# Patient Record
Sex: Female | Born: 1985 | Race: White | Hispanic: No | Marital: Married | Smoking: Never smoker
Health system: Southern US, Community
[De-identification: ages and names within clinical notes are randomized; demographics above are authoritative.]

## PROBLEM LIST (undated history)

## (undated) ENCOUNTER — Inpatient Hospital Stay (HOSPITAL_COMMUNITY): Payer: Self-pay

## (undated) DIAGNOSIS — E039 Hypothyroidism, unspecified: Secondary | ICD-10-CM

---

## 2005-05-04 ENCOUNTER — Emergency Department (HOSPITAL_COMMUNITY): Admission: EM | Admit: 2005-05-04 | Discharge: 2005-05-04 | Payer: Self-pay | Admitting: Emergency Medicine

## 2005-05-10 ENCOUNTER — Emergency Department (HOSPITAL_COMMUNITY): Admission: EM | Admit: 2005-05-10 | Discharge: 2005-05-10 | Payer: Self-pay | Admitting: Family Medicine

## 2005-09-19 ENCOUNTER — Emergency Department (HOSPITAL_COMMUNITY): Admission: EM | Admit: 2005-09-19 | Discharge: 2005-09-19 | Payer: Self-pay | Admitting: Family Medicine

## 2005-10-30 ENCOUNTER — Emergency Department (HOSPITAL_COMMUNITY): Admission: EM | Admit: 2005-10-30 | Discharge: 2005-10-30 | Payer: Self-pay | Admitting: Family Medicine

## 2005-12-07 ENCOUNTER — Emergency Department (HOSPITAL_COMMUNITY): Admission: EM | Admit: 2005-12-07 | Discharge: 2005-12-07 | Payer: Self-pay | Admitting: Emergency Medicine

## 2007-03-26 ENCOUNTER — Other Ambulatory Visit: Admission: RE | Admit: 2007-03-26 | Discharge: 2007-03-26 | Payer: Self-pay | Admitting: Obstetrics and Gynecology

## 2007-09-22 ENCOUNTER — Emergency Department (HOSPITAL_COMMUNITY): Admission: EM | Admit: 2007-09-22 | Discharge: 2007-09-22 | Payer: Self-pay | Admitting: Family Medicine

## 2008-05-06 ENCOUNTER — Other Ambulatory Visit: Admission: RE | Admit: 2008-05-06 | Discharge: 2008-05-06 | Payer: Self-pay | Admitting: Obstetrics and Gynecology

## 2009-08-19 ENCOUNTER — Other Ambulatory Visit: Admission: RE | Admit: 2009-08-19 | Discharge: 2009-08-19 | Payer: Self-pay | Admitting: Obstetrics and Gynecology

## 2010-03-16 ENCOUNTER — Ambulatory Visit (HOSPITAL_COMMUNITY): Admission: RE | Admit: 2010-03-16 | Discharge: 2010-03-16 | Payer: Self-pay | Admitting: Obstetrics and Gynecology

## 2010-04-06 ENCOUNTER — Ambulatory Visit (HOSPITAL_COMMUNITY): Admission: RE | Admit: 2010-04-06 | Discharge: 2010-04-06 | Payer: Self-pay | Admitting: Obstetrics and Gynecology

## 2010-05-05 ENCOUNTER — Ambulatory Visit (HOSPITAL_COMMUNITY): Admission: RE | Admit: 2010-05-05 | Discharge: 2010-05-05 | Payer: Self-pay | Admitting: Obstetrics and Gynecology

## 2010-06-02 ENCOUNTER — Ambulatory Visit (HOSPITAL_COMMUNITY): Admission: RE | Admit: 2010-06-02 | Discharge: 2010-06-02 | Payer: Self-pay | Admitting: Obstetrics and Gynecology

## 2010-06-18 ENCOUNTER — Inpatient Hospital Stay (HOSPITAL_COMMUNITY): Admission: AD | Admit: 2010-06-18 | Discharge: 2010-06-18 | Payer: Self-pay | Admitting: Obstetrics and Gynecology

## 2010-08-12 ENCOUNTER — Inpatient Hospital Stay (HOSPITAL_COMMUNITY)
Admission: AD | Admit: 2010-08-12 | Discharge: 2010-08-13 | Payer: Self-pay | Source: Home / Self Care | Attending: Obstetrics and Gynecology | Admitting: Obstetrics and Gynecology

## 2010-08-12 LAB — COMPREHENSIVE METABOLIC PANEL
ALT: 14 U/L (ref 0–35)
AST: 19 U/L (ref 0–37)
Albumin: 3 g/dL — ABNORMAL LOW (ref 3.5–5.2)
Alkaline Phosphatase: 85 U/L (ref 39–117)
BUN: 2 mg/dL — ABNORMAL LOW (ref 6–23)
CO2: 25 mEq/L (ref 19–32)
Calcium: 9 mg/dL (ref 8.4–10.5)
Chloride: 106 mEq/L (ref 96–112)
Creatinine, Ser: 0.58 mg/dL (ref 0.4–1.2)
GFR calc Af Amer: >60 mL/min (ref >60)
GFR calc non Af Amer: >60 mL/min (ref >60)
Glucose, Bld: 84 mg/dL (ref 70–99)
Potassium: 3.7 mEq/L (ref 3.5–5.1)
Sodium: 138 mEq/L (ref 135–145)
Total Bilirubin: 0.4 mg/dL (ref 0.3–1.2)
Total Protein: 6.3 g/dL (ref 6.0–8.3)

## 2010-08-12 LAB — CBC
HCT: 34.3 % — ABNORMAL LOW (ref 36.0–46.0)
Hemoglobin: 11.3 g/dL — ABNORMAL LOW (ref 12.0–15.0)
MCH: 29.3 pg (ref 26.0–34.0)
MCHC: 32.9 g/dL (ref 30.0–36.0)
MCV: 88.9 fL (ref 78.0–100.0)
Platelets: 276 10*3/uL (ref 150–400)
RBC: 3.86 MIL/uL — ABNORMAL LOW (ref 3.87–5.11)
RDW: 14.8 % (ref 11.5–15.5)
WBC: 12 10*3/uL — ABNORMAL HIGH (ref 4.0–10.5)

## 2010-08-14 ENCOUNTER — Inpatient Hospital Stay (HOSPITAL_COMMUNITY)
Admission: AD | Admit: 2010-08-14 | Discharge: 2010-08-15 | Payer: Self-pay | Source: Home / Self Care | Attending: Obstetrics and Gynecology | Admitting: Obstetrics and Gynecology

## 2010-08-22 LAB — CBC
HCT: 29.6 % — ABNORMAL LOW (ref 36.0–46.0)
Hemoglobin: 9.6 g/dL — ABNORMAL LOW (ref 12.0–15.0)
MCH: 28.8 pg (ref 26.0–34.0)
MCHC: 32.4 g/dL (ref 30.0–36.0)
MCV: 88.9 fL (ref 78.0–100.0)
Platelets: 243 10*3/uL (ref 150–400)
RBC: 3.33 MIL/uL — ABNORMAL LOW (ref 3.87–5.11)
RDW: 14.9 % (ref 11.5–15.5)
WBC: 9.9 10*3/uL (ref 4.0–10.5)

## 2010-08-22 LAB — STREP B DNA PROBE: Strep Group B Ag: NEGATIVE

## 2010-08-28 ENCOUNTER — Encounter: Payer: Self-pay | Admitting: Internal Medicine

## 2010-08-28 ENCOUNTER — Encounter: Payer: Self-pay | Admitting: Obstetrics and Gynecology

## 2010-09-07 ENCOUNTER — Inpatient Hospital Stay (HOSPITAL_COMMUNITY)
Admission: AD | Admit: 2010-09-07 | Discharge: 2010-09-10 | DRG: 651 | Disposition: A | Discharge status: Home / Self Care | Payer: 59 | Source: Ambulatory Visit | Attending: Obstetrics and Gynecology | Admitting: Obstetrics and Gynecology

## 2010-09-07 DIAGNOSIS — E039 Hypothyroidism, unspecified: Secondary | ICD-10-CM | POA: Diagnosis present

## 2010-09-07 DIAGNOSIS — O4290 Premature rupture of membranes, unspecified as to length of time between rupture and onset of labor, unspecified weeks of gestation: Principal | ICD-10-CM | POA: Diagnosis present

## 2010-09-07 DIAGNOSIS — E079 Disorder of thyroid, unspecified: Secondary | ICD-10-CM | POA: Diagnosis present

## 2010-09-07 LAB — COMPREHENSIVE METABOLIC PANEL
ALT: 12 U/L (ref 0–35)
AST: 19 U/L (ref 0–37)
Albumin: 2.7 g/dL — ABNORMAL LOW (ref 3.5–5.2)
Alkaline Phosphatase: 85 U/L (ref 39–117)
BUN: 2 mg/dL — ABNORMAL LOW (ref 6–23)
CO2: 22 mEq/L (ref 19–32)
Calcium: 9.1 mg/dL (ref 8.4–10.5)
Chloride: 109 mEq/L (ref 96–112)
Creatinine, Ser: 0.53 mg/dL (ref 0.4–1.2)
GFR calc Af Amer: >60 mL/min (ref >60)
GFR calc non Af Amer: >60 mL/min (ref >60)
Glucose, Bld: 83 mg/dL (ref 70–99)
Potassium: 3.7 mEq/L (ref 3.5–5.1)
Sodium: 138 mEq/L (ref 135–145)
Total Bilirubin: 0.3 mg/dL (ref 0.3–1.2)
Total Protein: 5.5 g/dL — ABNORMAL LOW (ref 6.0–8.3)

## 2010-09-07 LAB — LACTATE DEHYDROGENASE: LDH: 148 U/L (ref 94–250)

## 2010-09-07 LAB — CBC
HCT: 34 % — ABNORMAL LOW (ref 36.0–46.0)
Hemoglobin: 11.2 g/dL — ABNORMAL LOW (ref 12.0–15.0)
MCH: 29 pg (ref 26.0–34.0)
MCHC: 32.9 g/dL (ref 30.0–36.0)
MCV: 88.1 fL (ref 78.0–100.0)
Platelets: 245 10*3/uL (ref 150–400)
RBC: 3.86 MIL/uL — ABNORMAL LOW (ref 3.87–5.11)
RDW: 15.2 % (ref 11.5–15.5)
WBC: 10.8 10*3/uL — ABNORMAL HIGH (ref 4.0–10.5)

## 2010-09-07 LAB — URIC ACID: Uric Acid, Serum: 5.6 mg/dL (ref 2.4–7.0)

## 2010-09-07 LAB — RPR: RPR Ser Ql: NONREACTIVE

## 2010-09-09 LAB — CBC
HCT: 28.2 % — ABNORMAL LOW (ref 36.0–46.0)
Hemoglobin: 9.1 g/dL — ABNORMAL LOW (ref 12.0–15.0)
MCH: 29.3 pg (ref 26.0–34.0)
MCHC: 32.3 g/dL (ref 30.0–36.0)
MCV: 90.7 fL (ref 78.0–100.0)
Platelets: 194 10*3/uL (ref 150–400)
RBC: 3.11 MIL/uL — ABNORMAL LOW (ref 3.87–5.11)
RDW: 15.5 % (ref 11.5–15.5)
WBC: 9 10*3/uL (ref 4.0–10.5)

## 2010-09-10 NOTE — Discharge Summary (Signed)
  NAMEMACAELA, Dawn Navarro NO.:  [id number]  MEDICAL RECORD NO.:  [id number]          PATIENT TYPE:  INP  LOCATION:  9155                          FACILITY:  [facility]  PHYSICIAN:  Carrington Clamp, M.D. DATE OF BIRTH:  [birth year]  DATE OF ADMISSION:  08/12/2010 DATE OF DISCHARGE:  08/13/2010                              DISCHARGE SUMMARY   FINAL DIAGNOSES: 1. Intrauterine pregnancy at 56 and 3/7th weeks gestation. 2. Bronchitis.  COMPLICATIONS:  None.  This 25 year old G1, P0 presents at 71 weeks' gestation complaining of difficulty breathing.  The patient had been seen in November, was given a Z-Pak for an upper respiratory infection.  She never had a fever.  She had been seen in Urgent Care on August 11, 2010, and given amoxicillin t.i.d.  The patient presenting today to the [facility] with difficulty breathing.  The patient's antepartum course up to this point had been uncomplicated.  She is hypothyroid and has been on 100 mcg of levothyroxine throughout this pregnancy.  Upon admission, she was satting 98% on room air.  She was afebrile and lungs were clear.  The patient did have a chest x-ray performed which was completely negative. The patient was admitted.  No signs of any type of contractions.  She was started on IV antibiotics and was to seen by the respiratory therapist.  By hospital day #2, the patient was breathing better.  Fetal heart tones were reactive.  White blood cell count was about 9.9.  The patient responded well to inhaler and antibiotics and she was felt ready for discharge.  She was sent home on regular diet, told to decrease activities, was told to continue her albuterol twice daily as directed, and her Augmentin 875 mg twice daily for the next 7 days. Instructions and precautions were reviewed with the patient, she was to follow up in our office in 1 week.  LABORATORY DATA ON DISCHARGE:  The patient had a hemoglobin of 9.6, white blood  cell count of 9.9, and platelets of 243,000.     Leilani Able, P.A.-C.   ______________________________ Carrington Clamp, M.D.    MB/MEDQ  D:  08/22/2010  T:  08/23/2010  Job:  [id number]  Electronically Signed by Leilani Able P.A.-C. on 09/05/2010 01:05:17 PM Electronically Signed by Carrington Clamp MD on 09/10/2010 03:44:07 PM

## 2010-09-18 NOTE — Discharge Summary (Signed)
  NAMEANASTACIA, Dawn Navarro               ACCOUNT NO.:  [id number]  MEDICAL RECORD NO.:  [id number]           PATIENT TYPE:  I  LOCATION:  9101                          [facility]:  [facility]  PHYSICIAN:  Jarel Cuadra H. Tenny Craw, MD     DATE OF BIRTH:  12-28-1985  DATE OF ADMISSION:  09/07/2010 DATE OF DISCHARGE:  09/10/2010                              DISCHARGE SUMMARY   ADMITTING PHYSICIAN:  Dr. Ilda Mori.  DISCHARGING PHYSICIAN:  Dr. Almon Hercules.  HOSPITAL DIAGNOSES: 1. 37 weeks' intrauterine pregnancy. 2. Premature rupture of membranes. 3. Augmentation of labor. 4. Primary low transverse cesarean section for failure to progress.  HOSPITAL COURSE:  Ms. Balboni is a 25 year old gravida 1, para 0 who presented at 37 weeks and 3 days of estimated gestational age with spontaneous rupture of membranes.  She was confirmed to be ruptured in maternity admission, however, her cervix was closed.  She was admitted to labor and delivery and underwent labor augmentation with Cytotec. She proceeded to approximately a centimeter dilated, had an intrauterine pressure catheter placed.  The contractions appeared to be adequate. However, she made no further cervical change and was becoming exhausted and the decision was made to proceed with a primary low transverse cesarean section for failure to progress.  This was performed by Dr. Malva Limes on February 2 for delivery of a vigorous female infant in vertex presentation weighing 7 pounds 1 ounce with Apgar scores of 8 and 9.  Postoperatively, the patient did well.  She had good return of bowel function, excellent pain control, and was ambulating well.  She was breast feeding and supplementing without difficulty.  She on postoperative day #2 strongly desired early discharge home and was deemed ready for discharge.  She was instructed on signs and symptoms to be aware for need to return to the hospital.  She was given prescriptions for Percocet for pain  control.  Prior to discharge, she was given measles mumps rubella vaccination due to the fact that she was rubella equivocal.  She will follow up in the office in 4 weeks.  DISCHARGE LABORATORY DATA:  White blood cell count 9.0, hemoglobin 9.1, hematocrit 28.2, and platelet 194.     Freddrick March. Tenny Craw, MD     KHR/MEDQ  D:  09/10/2010  T:  09/11/2010  Job:  [id number]  Electronically Signed by Waynard Reeds MD on 09/16/2010 12:43:09 PM

## 2010-09-20 NOTE — Op Note (Signed)
NAME:  Dawn Navarro, HUOT NO.:  [id number]  MEDICAL RECORD NO.:  [id number]           PATIENT TYPE:  LOCATION:                                 FACILITY:  PHYSICIAN:  Malva Limes, M.D.    DATE OF BIRTH:  1986-04-05  DATE OF PROCEDURE:  09/08/2010 DATE OF DISCHARGE:                              OPERATIVE REPORT   PREOPERATIVE DIAGNOSES: 1. Intrauterine pregnancy at term. 2. Failure to progress.  POSTOPERATIVE DIAGNOSES: 1. Intrauterine pregnancy at term. 2. Failure to progress.  PROCEDURE:  Primary low transverse cesarean section.  SURGEON:  Malva Limes, M.D.  ANESTHESIA:  Epidural.  ANTIBIOTICS:  Ancef 1 g.  DRAINS:  Foley bedside drainage.  ESTIMATED BLOOD LOSS:  900 mL.  SPECIMENS:  None.  COMPLICATIONS:  None.  FINDINGS:  The patient had normal fallopian tubes and ovaries bilaterally.  The uterus appeared to be normal.  The patient delivered 1 live viable white female infant weighing 7 pounds 1 ounce.  INDICATIONS:  Ms. Dawn Navarro is a 25 year old white female G1, P0, [facility] September 25, 2010, was admitted on September 08, 2010, complaining of spontaneous rupture of membranes approximately 3 o'clock in the morning. The patient's prenatal care was complicated by hypothyroidism and morbid obesity.  On admission, the patient was noted to have positive fern, positive pooling.  Her cervix at that time was closed.  She was admitted and given several doses of Cytotec at approximately 6:45 on September 07, 2010, the patient was begun on Pitocin.  This was continued throughout the night.  On the morning of September 08, 2010, the patient had an IUPC placed to document adequate contractions.  At noon, the patient still was 1 cm with significant caput being noted.  The patient stated that she was exhausted and wished to no longer proceed with attempted vaginal birth.  She expressed a desire to have a primary cesarean section.  At this point, the patient was taken  to the operating room for this procedure.  PROCEDURE:  The patient was placed in dorsal supine position with a left lateral tilt.  Once an adequate level was reached, the patient was prepped and draped in the usual fashion for this procedure.  A Pfannenstiel incision was made.  On entering the abdominal cavity, the bladder flap was taken down with sharp dissection.  A low-transverse uterine incision was made in the midline using the Metzenbaum scissors. Amniotic fluid was noted be clear.  The infant was delivered in a vertex presentation with a vacuum extractor.  Remaining infant was then delivered.  There was a nuchal cord x1.  The cord was doubly clamped and cut and the infant handed to the awaiting NICU team.  Placenta was manually removed.  The uterus exteriorized.  The uterine cavity was wiped with a wet lap and examined.  Uterine incision was closed with a single layer of 0 Monocryl suture in a running locking fashion.  The bladder flap was closed using 2-0 Monocryl in a running fashion. Fallopian tubes and ovaries were examined.  The uterus was placed into the abdominal cavity and hemostasis again checked.  It was  felt to be adequate.  The parietal peritoneum and rectus muscles were reapproximated in the midline using 2-0 Monocryl in a running fashion. The fascia was closed using 0 Monocryl suture in a running fashion.  The subcuticular tissue was made hemostatic with the Bovie and closed with interrupted 2-0 plain gut suture, 3-0 Vicryl was used to close the incision in a subcuticular fashion.  Steri-Strips were then placed.  The patient was taken to recovery room in stable condition.  Instrument and lap counts correct x3.          ______________________________ Malva Limes, M.D.     MA/MEDQ  D:  09/08/2010  T:  09/09/2010  Job:  [id number]  Electronically Signed by Malva Limes M.D. on 09/13/2010 12:27:41 PM

## 2010-10-18 LAB — URINALYSIS, ROUTINE W REFLEX MICROSCOPIC
Bilirubin Urine: NEGATIVE
Glucose, UA: NEGATIVE mg/dL
Hgb urine dipstick: NEGATIVE
Ketones, ur: NEGATIVE mg/dL
Nitrite: NEGATIVE
Protein, ur: NEGATIVE mg/dL
Specific Gravity, Urine: <1.005 — ABNORMAL LOW (ref 1.005–1.030)
Urobilinogen, UA: 0.2 mg/dL (ref 0.0–1.0)
pH: 7 (ref 5.0–8.0)

## 2010-10-18 LAB — CBC
HCT: 32.9 % — ABNORMAL LOW (ref 36.0–46.0)
Hemoglobin: 11.3 g/dL — ABNORMAL LOW (ref 12.0–15.0)
MCH: 31 pg (ref 26.0–34.0)
MCHC: 34.2 g/dL (ref 30.0–36.0)
MCV: 90.7 fL (ref 78.0–100.0)
Platelets: 266 10*3/uL (ref 150–400)
RBC: 3.63 MIL/uL — ABNORMAL LOW (ref 3.87–5.11)
RDW: 14.1 % (ref 11.5–15.5)
WBC: 10.5 10*3/uL (ref 4.0–10.5)

## 2010-10-18 LAB — COMPREHENSIVE METABOLIC PANEL
ALT: 12 U/L (ref 0–35)
AST: 13 U/L (ref 0–37)
Albumin: 3 g/dL — ABNORMAL LOW (ref 3.5–5.2)
Alkaline Phosphatase: 61 U/L (ref 39–117)
BUN: 4 mg/dL — ABNORMAL LOW (ref 6–23)
CO2: 25 mEq/L (ref 19–32)
Calcium: 9.4 mg/dL (ref 8.4–10.5)
Chloride: 104 mEq/L (ref 96–112)
Creatinine, Ser: 0.58 mg/dL (ref 0.4–1.2)
GFR calc Af Amer: >60 mL/min (ref >60)
GFR calc non Af Amer: >60 mL/min (ref >60)
Glucose, Bld: 89 mg/dL (ref 70–99)
Potassium: 4 mEq/L (ref 3.5–5.1)
Sodium: 137 mEq/L (ref 135–145)
Total Bilirubin: 0.4 mg/dL (ref 0.3–1.2)
Total Protein: 6.7 g/dL (ref 6.0–8.3)

## 2010-10-18 LAB — URIC ACID: Uric Acid, Serum: 4.8 mg/dL (ref 2.4–7.0)

## 2011-05-25 ENCOUNTER — Other Ambulatory Visit: Payer: BC Managed Care – PPO | Admitting: Internal Medicine

## 2011-05-25 ENCOUNTER — Encounter: Payer: Self-pay | Admitting: Internal Medicine

## 2011-05-25 DIAGNOSIS — Z0000 Encounter for general adult medical examination without abnormal findings: Secondary | ICD-10-CM

## 2011-05-25 LAB — LIPID PANEL
Cholesterol: 261 mg/dL — ABNORMAL HIGH (ref 0–200)
HDL: 38 mg/dL — ABNORMAL LOW (ref >39)
LDL Cholesterol: 182 mg/dL — ABNORMAL HIGH (ref 0–99)
Total CHOL/HDL Ratio: 6.9 Ratio
Triglycerides: 204 mg/dL — ABNORMAL HIGH (ref <150)
VLDL: 41 mg/dL — ABNORMAL HIGH (ref 0–40)

## 2011-05-25 LAB — CBC WITH DIFFERENTIAL/PLATELET
Basophils Absolute: 0 10*3/uL (ref 0.0–0.1)
Basophils Relative: 0 % (ref 0–1)
Eosinophils Absolute: 0.2 10*3/uL (ref 0.0–0.7)
Eosinophils Relative: 2 % (ref 0–5)
HCT: 40.4 % (ref 36.0–46.0)
Hemoglobin: 12.7 g/dL (ref 12.0–15.0)
Lymphocytes Relative: 28 % (ref 12–46)
Lymphs Abs: 2.4 10*3/uL (ref 0.7–4.0)
MCH: 28.7 pg (ref 26.0–34.0)
MCHC: 31.4 g/dL (ref 30.0–36.0)
MCV: 91.4 fL (ref 78.0–100.0)
Monocytes Absolute: 0.6 10*3/uL (ref 0.1–1.0)
Monocytes Relative: 7 % (ref 3–12)
Neutro Abs: 5.4 10*3/uL (ref 1.7–7.7)
Neutrophils Relative %: 63 % (ref 43–77)
Platelets: 339 10*3/uL (ref 150–400)
RBC: 4.42 MIL/uL (ref 3.87–5.11)
RDW: 14.7 % (ref 11.5–15.5)
WBC: 8.5 10*3/uL (ref 4.0–10.5)

## 2011-05-25 LAB — COMPREHENSIVE METABOLIC PANEL
ALT: 11 U/L (ref 0–35)
AST: 11 U/L (ref 0–37)
Albumin: 4.1 g/dL (ref 3.5–5.2)
Alkaline Phosphatase: 55 U/L (ref 39–117)
BUN: 8 mg/dL (ref 6–23)
CO2: 26 mEq/L (ref 19–32)
Calcium: 8.9 mg/dL (ref 8.4–10.5)
Chloride: 106 mEq/L (ref 96–112)
Creat: 0.79 mg/dL (ref 0.50–1.10)
Glucose, Bld: 90 mg/dL (ref 70–99)
Potassium: 4.3 mEq/L (ref 3.5–5.3)
Sodium: 139 mEq/L (ref 135–145)
Total Bilirubin: 0.4 mg/dL (ref 0.3–1.2)
Total Protein: 6.6 g/dL (ref 6.0–8.3)

## 2011-05-25 LAB — TSH: TSH: 1.169 u[IU]/mL (ref 0.350–4.500)

## 2011-05-26 ENCOUNTER — Ambulatory Visit (INDEPENDENT_AMBULATORY_CARE_PROVIDER_SITE_OTHER): Payer: BC Managed Care – PPO | Admitting: Internal Medicine

## 2011-05-26 ENCOUNTER — Encounter: Payer: Self-pay | Admitting: Internal Medicine

## 2011-05-26 VITALS — BP 108/84 | HR 92 | Temp 98.9°F | Ht 67.0 in | Wt 272.0 lb

## 2011-05-26 DIAGNOSIS — Z23 Encounter for immunization: Secondary | ICD-10-CM

## 2011-05-26 DIAGNOSIS — Z0000 Encounter for general adult medical examination without abnormal findings: Secondary | ICD-10-CM

## 2011-05-26 DIAGNOSIS — E669 Obesity, unspecified: Secondary | ICD-10-CM

## 2011-05-26 DIAGNOSIS — E039 Hypothyroidism, unspecified: Secondary | ICD-10-CM

## 2011-05-26 DIAGNOSIS — E282 Polycystic ovarian syndrome: Secondary | ICD-10-CM

## 2011-05-26 LAB — POCT URINALYSIS DIPSTICK
Blood, UA: NEGATIVE
Leukocytes, UA: NEGATIVE
Nitrite, UA: NEGATIVE
Protein, UA: NEGATIVE
pH, UA: 7

## 2011-05-26 LAB — VITAMIN D 25 HYDROXY (VIT D DEFICIENCY, FRACTURES): Vit D, 25-Hydroxy: 31 ng/mL (ref 30–89)

## 2011-06-03 NOTE — Progress Notes (Signed)
  Subjective:    Patient ID: Dawn Navarro, female    DOB: [birth year], 25 y.o.   MRN: [id number]  HPI first visit for this 25 year old white female living in [place] with history of polycystic ovary syndrome. Took Clomid to get pregnant. Delivered a baby in February 2012 by C-section for failure to progress. History of hypothyroidism and has been on levothyroxin 0.1 mg daily for 2 years. Is on oral contraceptives. 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 Patient is a 4 year college degree. Is married. Does not smoke or consume alcohol. TSH last checked July 2011 by GYN in result was 1.174. Free T4 was 1.22 . Dawn Navarro is worried about having polycystic ovary syndrome and what the consequences might be.  Family history mother with history of hypothyroidism and is 71 years of age. Father age 53 on chemotherapy with history of non-Hodgkin's lymphoma and remote history of prostate cancer. 2 brothers in good health.   History of irregular menses. GYN care done through [facility]. Treated for 10 vaginitis by GYN August 2012 and June 2012. In March 2012 had Pap smear with ASCUS .    Review of Systems noncontributory except for irregular menses and episodes of Candida vaginitis     Objective:   Physical Exam  Vitals reviewed. Constitutional: Dawn Navarro is oriented to person, place, and time.  HENT:  Head: Normocephalic.  Right Ear: External ear normal.  Left Ear: External ear normal.  Mouth/Throat: Oropharynx is clear and moist.  Neck: Neck supple. No thyromegaly present.  Cardiovascular: Normal rate, regular rhythm and normal heart sounds.   No murmur heard. Pulmonary/Chest: Effort normal and breath sounds normal. Dawn Navarro has no wheezes. Dawn Navarro has no rales.  Abdominal: Soft. Bowel sounds are normal. Dawn Navarro exhibits no mass. There is no tenderness.  Genitourinary:       Deferred to GYN physician  Musculoskeletal: Dawn Navarro exhibits no edema.  Lymphadenopathy:    Dawn Navarro  has no cervical adenopathy.  Neurological: Dawn Navarro is alert and oriented to person, place, and time. Dawn Navarro has normal reflexes. No cranial nerve deficit.  Skin: Skin is warm and dry.  Psychiatric: Dawn Navarro has a normal mood and affect. Her behavior is normal. Judgment and thought content normal.          Assessment & Plan:  Polycystic ovary syndrome by history  Hypothyroidism on thyroid replacement therapy  Obesity  Status post C-section February 2012. Conceived using Clomid  Plan: Suggested to patient Dawn Navarro may want to see endocrinologist for consultation and Dawn Navarro is agreeable with this.

## 2011-06-04 DIAGNOSIS — E669 Obesity, unspecified: Secondary | ICD-10-CM | POA: Insufficient documentation

## 2011-06-04 DIAGNOSIS — E039 Hypothyroidism, unspecified: Secondary | ICD-10-CM | POA: Insufficient documentation

## 2011-06-04 DIAGNOSIS — E282 Polycystic ovarian syndrome: Secondary | ICD-10-CM | POA: Insufficient documentation

## 2011-06-04 NOTE — Patient Instructions (Addendum)
Thyroid functions have been checked today. Continue thyroid replacement therapy. We will [facility] appointment for you to see endocrinologist regarding polycystic ovary syndrome issues. Influenza immunization administered today

## 2011-09-11 ENCOUNTER — Encounter: Payer: Self-pay | Admitting: Internal Medicine

## 2011-09-11 ENCOUNTER — Ambulatory Visit (INDEPENDENT_AMBULATORY_CARE_PROVIDER_SITE_OTHER): Payer: BC Managed Care – PPO | Admitting: Internal Medicine

## 2011-09-11 VITALS — BP 102/80 | HR 84 | Temp 98.9°F | Wt 273.0 lb

## 2011-09-11 DIAGNOSIS — J069 Acute upper respiratory infection, unspecified: Secondary | ICD-10-CM

## 2011-09-11 DIAGNOSIS — H6690 Otitis media, unspecified, unspecified ear: Secondary | ICD-10-CM

## 2011-09-11 DIAGNOSIS — H6691 Otitis media, unspecified, right ear: Secondary | ICD-10-CM

## 2011-09-11 NOTE — Progress Notes (Signed)
  Subjective:    Patient ID: Dawn Navarro, female    DOB: [birth year], 26 y.o.   MRN: [id number]  HPI 26 year old white female who has a 27-year-old in daycare. Has been exposed to a lot of illnesses recently due to child being sick quite a bit. Patient has had respiratory congestion, sore throat, postnasal drip, discolored sputum production. Right ear feels full. No fever or shaking chills.    Review of Systems     Objective:   Physical Exam HEENT exam: Pharynx is slightly injected; right TM is full but not red; left TM slightly dull but not red; neck is supple without significant adenopathy; chest clear        Assessment & Plan:  Right otitis media  Upper respiratory infection  Plan: Zithromax Z-Pak take 2 tablets by mouth day one followed by 1 tablet by mouth days 2 through 5. Refill prescription if not better in one week. Hycodan 8 ounces 1 teaspoon by mouth every 6 hours when necessary cough.

## 2011-09-11 NOTE — Patient Instructions (Signed)
Take Zithromax Z-PAK as directed. Refill prescription if not better in one week. Take Hycodan at bedtime for cough.

## 2011-10-17 ENCOUNTER — Encounter: Payer: Self-pay | Admitting: Internal Medicine

## 2011-10-17 ENCOUNTER — Ambulatory Visit (INDEPENDENT_AMBULATORY_CARE_PROVIDER_SITE_OTHER): Payer: BC Managed Care – PPO | Admitting: Internal Medicine

## 2011-10-17 ENCOUNTER — Telehealth: Payer: Self-pay

## 2011-10-17 VITALS — BP 114/90 | HR 112 | Temp 99.3°F

## 2011-10-17 DIAGNOSIS — R110 Nausea: Secondary | ICD-10-CM

## 2011-10-17 MED ORDER — ONDANSETRON HCL 4 MG/2ML IJ SOLN
4.0000 mg | Freq: Once | INTRAMUSCULAR | Status: AC
  Administered 2011-10-17: 4 mg via INTRAMUSCULAR

## 2011-10-17 NOTE — Telephone Encounter (Signed)
Patient has GI virus, probably from infant daughter who has it also. Nausea, vomiting started last pm, now has diarrhea and low grade fever. Has Reglan at home, doesn't know if it would help the nausea.really would prefer not to come in.

## 2011-10-17 NOTE — Telephone Encounter (Signed)
See this afternoon

## 2011-10-18 NOTE — Progress Notes (Signed)
  Subjective:    Patient ID: Dawn Navarro, female    DOB: 10-Sep-1985, 26 y.o.   MRN: [id number]  HPI  26 year old white female with polycystic ovary syndrome and hypothyroidism who is daughter-in-law of Dashawn Golda also a patient here in today with nausea vomiting and diarrhea. Symptoms are severe. Patient has malaise and is pale.Apparently has had exposure to nor a virus. Other family members have been ill.     Review of Systems     Objective:   Physical Exam patient is volume depleted but not in need of IV fluids in my opinion.chest is clear, cardiac exam tachycardic. Regular rate and rhythm.        Assessment & Plan:  Gastroenteritis-likely nor ovarians  Plan: Injection of antiemetic here in the office. Phenergan rectal suppositories 25 mg 1 PR Q4 hours when necessary nausea. Sips of liquids over ice every one to 2 hours until can take more by mouth. Call if not better in 24 hours or sooner if worse.

## 2011-11-03 NOTE — Patient Instructions (Signed)
Use Phenergan as directed. Try sips of liquids over crushed ice every one to 2 hours. Stay away from milk water and orange juice. Try crackers or dry toast if tolerated. Call if not better in 24 hours

## 2011-11-27 ENCOUNTER — Other Ambulatory Visit: Payer: BC Managed Care – PPO | Admitting: Internal Medicine

## 2011-11-27 DIAGNOSIS — E039 Hypothyroidism, unspecified: Secondary | ICD-10-CM

## 2011-11-27 DIAGNOSIS — E7800 Pure hypercholesterolemia, unspecified: Secondary | ICD-10-CM

## 2011-11-27 LAB — LIPID PANEL
LDL Cholesterol: 169 mg/dL — ABNORMAL HIGH (ref 0–99)
Total CHOL/HDL Ratio: 7.9 Ratio

## 2011-11-27 LAB — TSH: TSH: 1.291 u[IU]/mL (ref 0.350–4.500)

## 2011-11-28 ENCOUNTER — Ambulatory Visit (INDEPENDENT_AMBULATORY_CARE_PROVIDER_SITE_OTHER): Payer: BC Managed Care – PPO | Admitting: Internal Medicine

## 2011-11-28 VITALS — BP 120/76 | HR 84 | Resp 20 | Ht 68.0 in | Wt 270.0 lb

## 2011-11-28 DIAGNOSIS — E785 Hyperlipidemia, unspecified: Secondary | ICD-10-CM

## 2011-11-28 DIAGNOSIS — E669 Obesity, unspecified: Secondary | ICD-10-CM

## 2011-11-28 DIAGNOSIS — E282 Polycystic ovarian syndrome: Secondary | ICD-10-CM

## 2011-12-03 ENCOUNTER — Encounter: Payer: Self-pay | Admitting: Internal Medicine

## 2011-12-03 NOTE — Patient Instructions (Signed)
Start Lipitor 10 mg daily and return in 3 months for fasting lipid panel liver functions and office visit.

## 2011-12-03 NOTE — Progress Notes (Signed)
  Subjective:    Patient ID: Dawn Navarro, female    DOB: [birth year], 26 y.o.   MRN: [id number]  HPI 26 year old white female overweight in today for followup of hyperlipidemia. Recent fasting lab work reviewed. No significant change and lipid panel over past 6 months. Both parents have history of hyperlipidemia. Father has history of hypertension. 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 History of polycystic ovary syndrome.   Review of Systems     Objective:   Physical Exam no thyromegaly; chest clear; cardiac exam regular rate and rhythm. Blood pressure 120/76. Pulse 84. Recent lipid panel shows total cholesterol of 260 and previously was 261 in October 2012. Triglycerides are now 290 and previously were 204. However her LDL cholesterol has improved from 182 to 169.        Assessment & Plan:  Obesity  Hyperlipidemia-familial mixed  Plan: Lipitor 10 mg daily and recheck fasting lipid panel liver functions in 3 months

## 2012-01-27 IMAGING — US US OB FOLLOW-UP
1 series · 18 of 28 positions shown · non-contrast
Comparison: none

OBSTETRICAL ULTRASOUND:
 This ultrasound was performed in The [HOSPITAL], and the [facility] report will be stored to [REDACTED] PACS.  This report is also available in [HOSPITAL]?s accessANYware.

[Series 1: us ob follow-up · 18 of 46 slices shown]
[im 1/46]
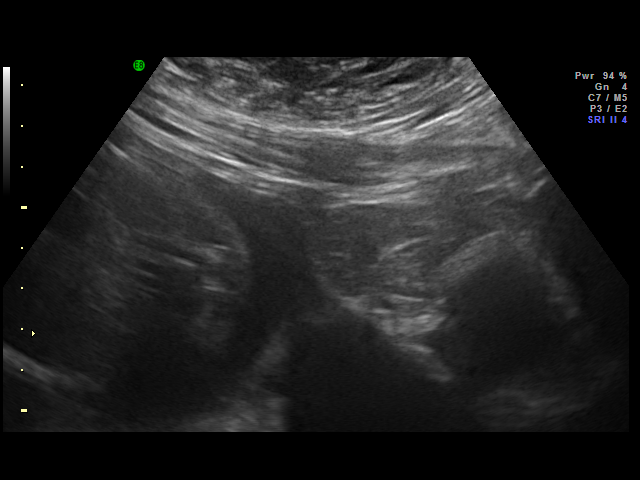
[im 4/46]
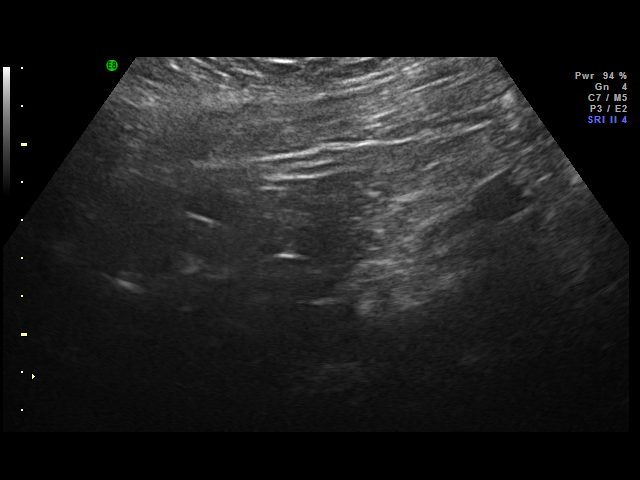
[im 6/46]
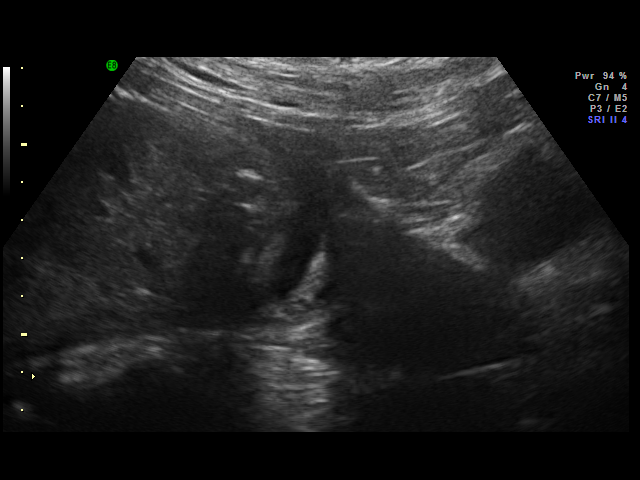
[im 9/46]
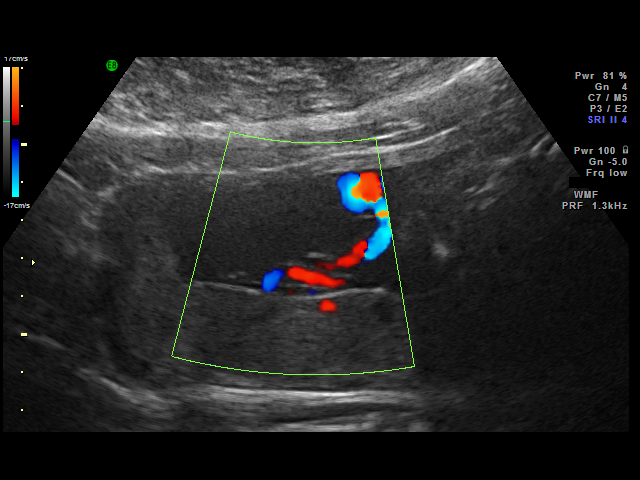
[im 12/46]
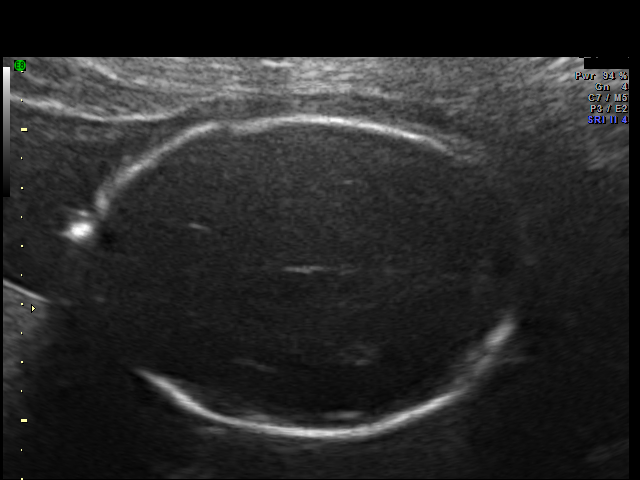
[im 14/46]
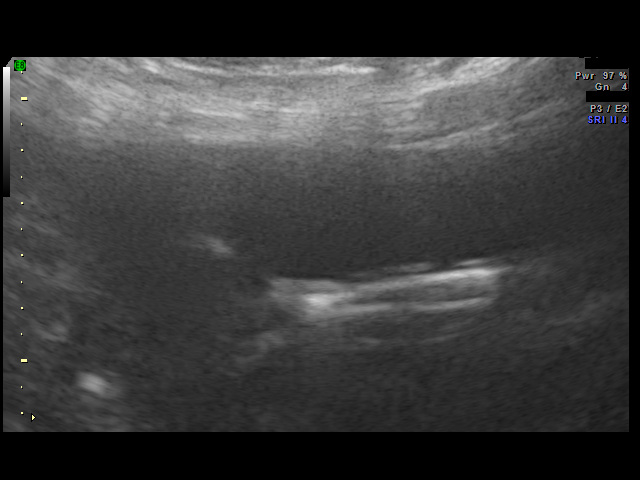
[im 17/46]
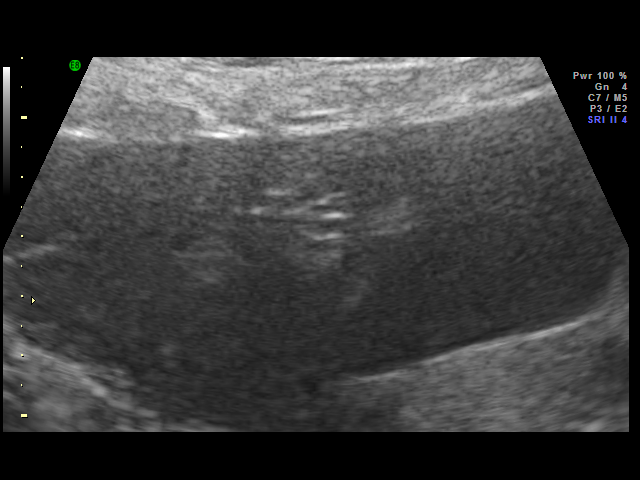
[im 19/46]
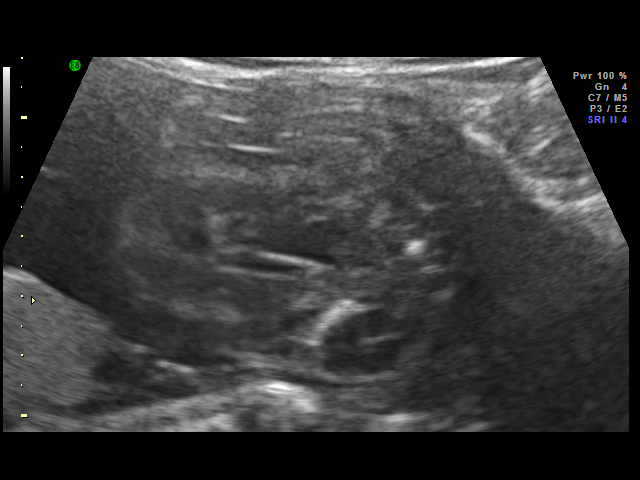
[im 22/46]
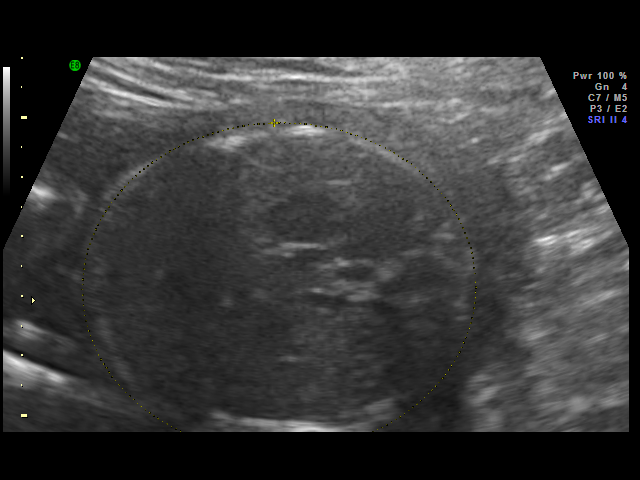
[im 24/46]
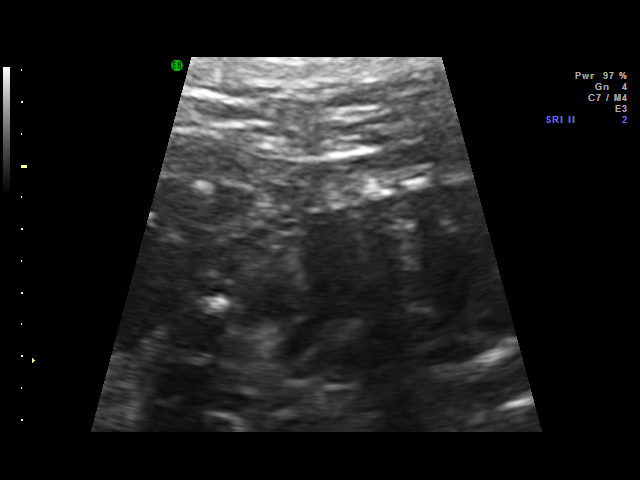
[im 27/46]
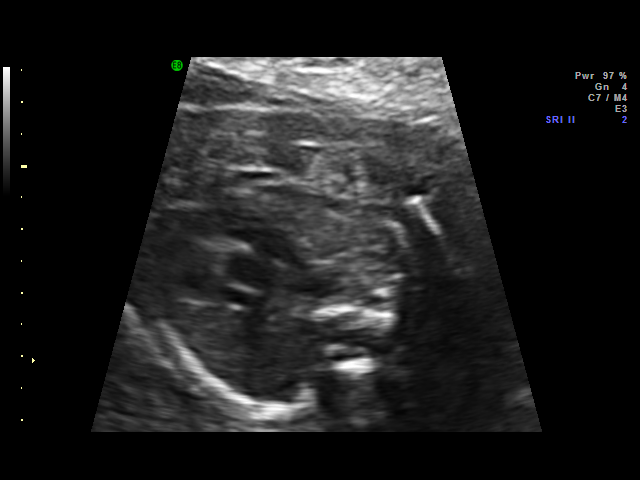
[im 29/46]
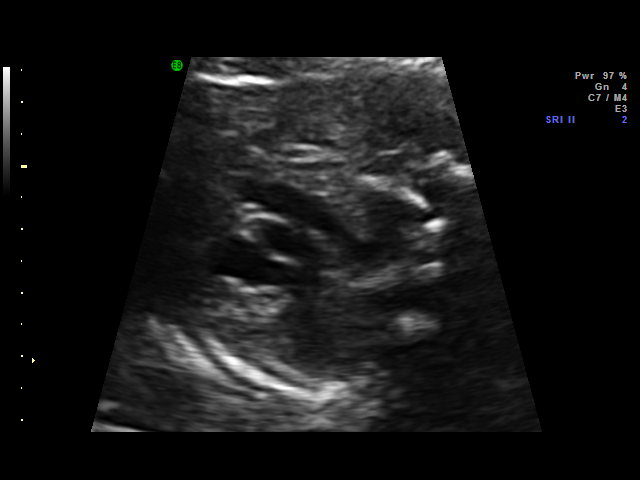
[im 32/46]
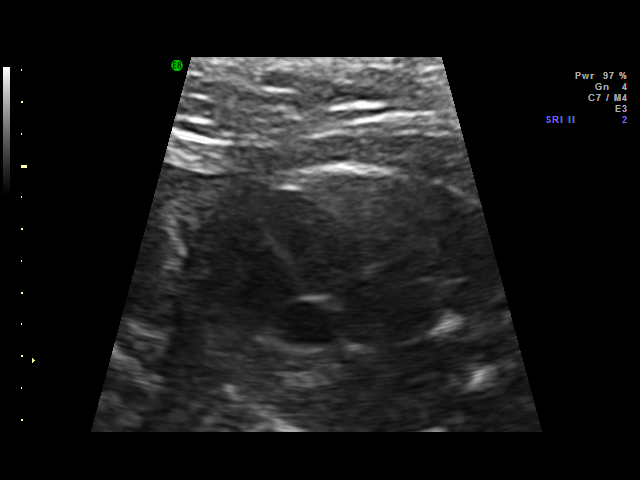
[im 36/46]
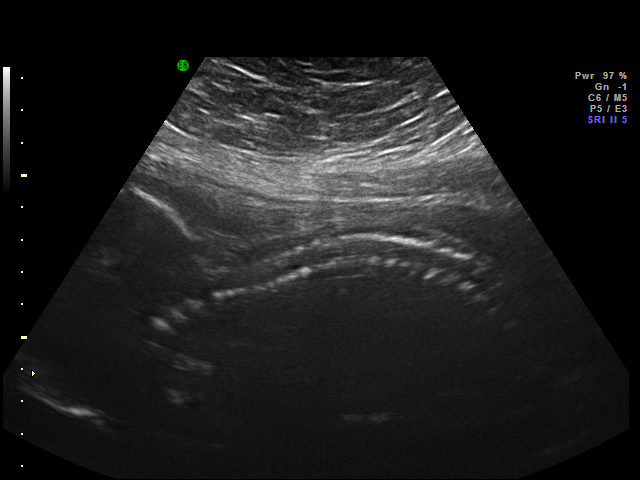
[im 37/46]
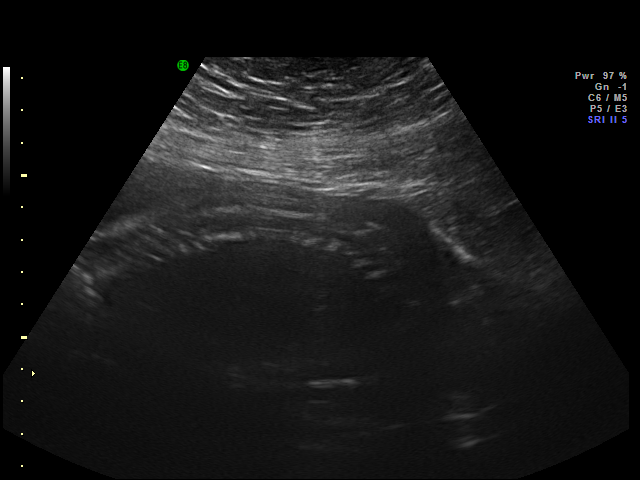
[im 41/46]
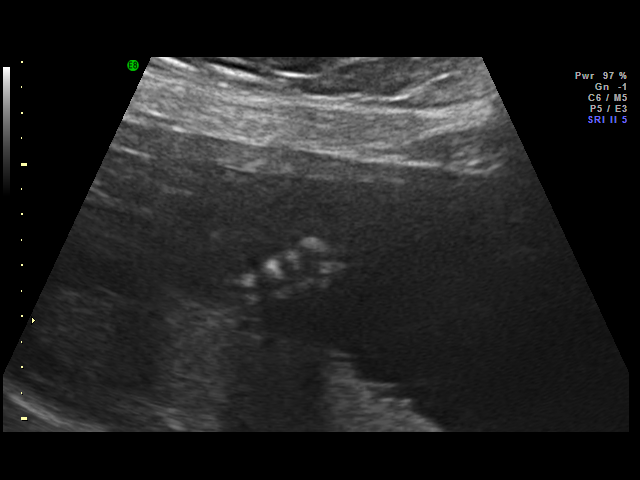
[im 42/46]
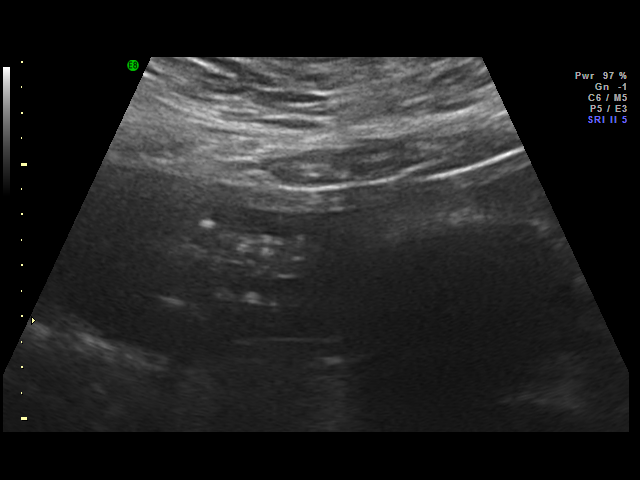
[im 46/46]
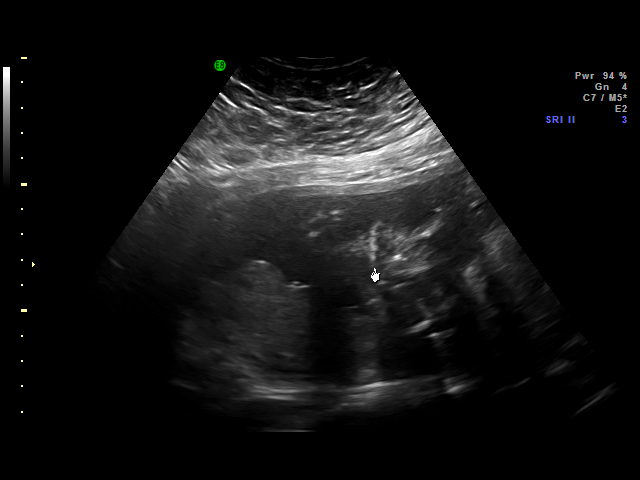

[18 of 28 positions shown; findings below may reference images not displayed]

IMPRESSION: [facility] has also been faxed to the ordering physician.

## 2012-01-29 ENCOUNTER — Encounter: Payer: Self-pay | Admitting: Internal Medicine

## 2012-01-29 ENCOUNTER — Ambulatory Visit (INDEPENDENT_AMBULATORY_CARE_PROVIDER_SITE_OTHER): Payer: BC Managed Care – PPO | Admitting: Internal Medicine

## 2012-01-29 VITALS — BP 116/78 | HR 88 | Temp 99.8°F | Wt 284.0 lb

## 2012-01-29 DIAGNOSIS — J029 Acute pharyngitis, unspecified: Secondary | ICD-10-CM

## 2012-01-29 DIAGNOSIS — J039 Acute tonsillitis, unspecified: Secondary | ICD-10-CM

## 2012-01-29 LAB — POCT RAPID STREP A (OFFICE): Rapid Strep A Screen: NEGATIVE

## 2012-01-29 NOTE — Patient Instructions (Addendum)
Take antibiotics as directed for 10 days. Take pain meds as needed. Call if not better or sooner if worse.

## 2012-01-30 NOTE — Progress Notes (Signed)
  Subjective:    Patient ID: Dawn Navarro, female    DOB: June 25, 1986, 26 y.o.   MRN: [id number]  HPI 26 year old white female in today with sore throat. Says pain is fairly severe. No documented fever. No cough or congestion. Did not sleep well last night because of pain. Onset of symptoms yesterday. No known strep exposure. No history of recurrent pharyngitis. History of hyperlipidemia.    Review of Systems     Objective:   Physical Exam HEENT exam: Exudate right tonsil. Both tonsils are red and inflamed. Rapid strep screen is negative. TMs are clear neck is supple without significant adenopathy. Chest clear.        Assessment & Plan:  Tonsillitis  Plan: Augmentin 500 mg 3 times daily for 10 days. Diflucan 150 mg tablet take one if Candida vaginitis developed while on antibiotic therapy. Has one refill of Diflucan. Should be considered contagious for 24 hours after starting the antibiotic.

## 2012-02-06 ENCOUNTER — Telehealth: Payer: Self-pay

## 2012-02-06 MED ORDER — LEVOFLOXACIN 750 MG PO TABS: 750.0000 mg | ORAL_TABLET | Freq: Every day | ORAL | Status: AC

## 2012-02-06 NOTE — Telephone Encounter (Signed)
Here for OV week and a 1/2 ago for tonsillitis. Finished augmentin and feels like symptoms are returning again. Sore throat and stuffiness. Asks if she needs another round of antibiotics

## 2012-02-06 NOTE — Telephone Encounter (Signed)
Change to Levaquin 750 mg daily with a large meal. #10

## 2012-02-09 ENCOUNTER — Telehealth: Payer: Self-pay | Admitting: Internal Medicine

## 2012-02-09 NOTE — Telephone Encounter (Signed)
Patient called 02/08/2012 complaining of Levaquin caused myalgias. She was placed on Levaquin because she said tonsillitis was returning. She did think Levaquin was superior to Augmentin in that symptoms had cleared up fairly quickly but she doesn't feel well on this medicine. She was started on Levaquin on 02-06-12 and called on 02/08/2012. Advised her to cut Levaquin in half and see if symptoms would disappear. She will contact me if symptoms do not clear up on Levaquin.

## 2012-02-26 ENCOUNTER — Other Ambulatory Visit: Payer: BC Managed Care – PPO | Admitting: Internal Medicine

## 2012-02-26 DIAGNOSIS — Z79899 Other long term (current) drug therapy: Secondary | ICD-10-CM

## 2012-02-26 DIAGNOSIS — E785 Hyperlipidemia, unspecified: Secondary | ICD-10-CM

## 2012-02-26 LAB — HEPATIC FUNCTION PANEL
Albumin: 3.8 g/dL (ref 3.5–5.2)
Bilirubin, Direct: 0.1 mg/dL (ref 0.0–0.3)
Total Bilirubin: 0.3 mg/dL (ref 0.3–1.2)

## 2012-02-26 LAB — LIPID PANEL
HDL: 30 mg/dL — ABNORMAL LOW (ref >39)
LDL Cholesterol: 105 mg/dL — ABNORMAL HIGH (ref 0–99)
Total CHOL/HDL Ratio: 6.1 Ratio

## 2012-02-27 ENCOUNTER — Encounter: Payer: Self-pay | Admitting: Internal Medicine

## 2012-02-27 ENCOUNTER — Ambulatory Visit (INDEPENDENT_AMBULATORY_CARE_PROVIDER_SITE_OTHER): Payer: BC Managed Care – PPO | Admitting: Internal Medicine

## 2012-02-27 VITALS — BP 126/80 | HR 84 | Temp 99.0°F | Ht 68.0 in | Wt 276.0 lb

## 2012-02-27 DIAGNOSIS — Z8669 Personal history of other diseases of the nervous system and sense organs: Secondary | ICD-10-CM | POA: Insufficient documentation

## 2012-02-27 MED ORDER — ATORVASTATIN CALCIUM 20 MG PO TABS: 20.0000 mg | ORAL_TABLET | Freq: Every day | ORAL | Status: DC

## 2012-03-03 NOTE — Patient Instructions (Addendum)
Increase Lipitor to 40 mg daily and return in 3-6 months

## 2012-03-03 NOTE — Progress Notes (Signed)
  Subjective:    Patient ID: Dawn Navarro, female    DOB: Nov 06, 1985, 26 y.o.   MRN: [id number]  HPI Patient in today to followup on hyperlipidemia. Recently started on lipid-lowering medication and this is three-month followup. Around July 4 she had a bad bout of tonsillitis. She weighs 276 pounds. She is on Lipitor 20 mg daily. Total cholesterol has improved from 260-183. Triglycerides have improved from 290-240. LDL cholesterol has improved from 169-105. All of this is impressive but I would like to see triglycerides normalized. Spoke with her about diet exercise and weight loss. Says she is trying. 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 History of polycystic ovary syndrome.    Review of Systems     Objective:   Physical Exam Neck supple without thyromegaly; chest clear to auscultation; cardiac exam regular rate and rhythm; extremities without edema       Assessment & Plan:  Hyperlipidemia- has both elevated cholesterol and triglycerides therefore this is the reason we selected Lipitor  Obesity  History of polycystic ovary syndrome  Plan: Increase Lipitor to 40 mg daily and recheck in 3-6 months.

## 2012-05-10 ENCOUNTER — Ambulatory Visit (INDEPENDENT_AMBULATORY_CARE_PROVIDER_SITE_OTHER): Payer: BC Managed Care – PPO | Admitting: Internal Medicine

## 2012-05-10 ENCOUNTER — Encounter: Payer: Self-pay | Admitting: Internal Medicine

## 2012-05-10 ENCOUNTER — Ambulatory Visit
Admission: RE | Admit: 2012-05-10 | Discharge: 2012-05-10 | Disposition: A | Discharge status: Home / Self Care | Payer: BC Managed Care – PPO | Source: Ambulatory Visit | Attending: Internal Medicine | Admitting: Internal Medicine

## 2012-05-10 VITALS — BP 126/84 | HR 88 | Temp 99.0°F | Wt 276.0 lb

## 2012-05-10 DIAGNOSIS — M542 Cervicalgia: Secondary | ICD-10-CM

## 2012-05-27 ENCOUNTER — Other Ambulatory Visit: Payer: BC Managed Care – PPO | Admitting: Internal Medicine

## 2012-05-27 DIAGNOSIS — Z79899 Other long term (current) drug therapy: Secondary | ICD-10-CM

## 2012-05-27 DIAGNOSIS — E039 Hypothyroidism, unspecified: Secondary | ICD-10-CM

## 2012-05-27 DIAGNOSIS — E785 Hyperlipidemia, unspecified: Secondary | ICD-10-CM

## 2012-05-27 LAB — HEPATIC FUNCTION PANEL
ALT: 9 U/L (ref 0–35)
AST: 12 U/L (ref 0–37)
Albumin: 3.9 g/dL (ref 3.5–5.2)
Total Protein: 6.2 g/dL (ref 6.0–8.3)

## 2012-05-27 LAB — LIPID PANEL
Cholesterol: 166 mg/dL (ref 0–200)
HDL: 33 mg/dL — ABNORMAL LOW (ref >39)
Total CHOL/HDL Ratio: 5 Ratio

## 2012-05-27 LAB — TSH: TSH: 1.985 u[IU]/mL (ref 0.350–4.500)

## 2012-05-28 ENCOUNTER — Encounter: Payer: Self-pay | Admitting: Internal Medicine

## 2012-05-28 ENCOUNTER — Ambulatory Visit (INDEPENDENT_AMBULATORY_CARE_PROVIDER_SITE_OTHER): Payer: BC Managed Care – PPO | Admitting: Internal Medicine

## 2012-05-28 VITALS — BP 130/74 | HR 80 | Temp 98.3°F | Wt 280.0 lb

## 2012-05-28 DIAGNOSIS — M79673 Pain in unspecified foot: Secondary | ICD-10-CM

## 2012-05-28 DIAGNOSIS — E669 Obesity, unspecified: Secondary | ICD-10-CM

## 2012-05-28 DIAGNOSIS — Z23 Encounter for immunization: Secondary | ICD-10-CM

## 2012-05-28 DIAGNOSIS — G44309 Post-traumatic headache, unspecified, not intractable: Secondary | ICD-10-CM

## 2012-05-28 DIAGNOSIS — E039 Hypothyroidism, unspecified: Secondary | ICD-10-CM

## 2012-05-28 DIAGNOSIS — M79609 Pain in unspecified limb: Secondary | ICD-10-CM

## 2012-05-28 DIAGNOSIS — E785 Hyperlipidemia, unspecified: Secondary | ICD-10-CM

## 2012-05-28 DIAGNOSIS — Z8669 Personal history of other diseases of the nervous system and sense organs: Secondary | ICD-10-CM

## 2012-05-28 MED ORDER — CYCLOBENZAPRINE HCL 10 MG PO TABS: 10.0000 mg | ORAL_TABLET | ORAL | Status: DC
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
## 2012-05-28 NOTE — Patient Instructions (Addendum)
Take Sterapred DS 10 mg 6 day dosepak to break headache cycle. Have x-ray of right foot done. If foot pain does not resolve with prednisone start taking Aleve 2 tablets by mouth twice daily with food. Continue Lipitor and Synthroid. Return in 6 months for physical exam. Continue Flexeril as needed for musculoskeletal pain and headache. Take Vicodin sparingly for headache. Stop Fioricet for now
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
## 2012-05-28 NOTE — Patient Instructions (Addendum)
Take Flexeril as needed for musculoskeletal pain. Take Vicodin as needed for headache or more severe pain. Watch for signs of concussion. Return as needed. Have C-spine films done.

## 2012-05-29 ENCOUNTER — Ambulatory Visit
Admission: RE | Admit: 2012-05-29 | Discharge: 2012-05-29 | Disposition: A | Discharge status: Home / Self Care | Payer: BC Managed Care – PPO | Source: Ambulatory Visit | Attending: Internal Medicine | Admitting: Internal Medicine

## 2012-05-29 DIAGNOSIS — M79673 Pain in unspecified foot: Secondary | ICD-10-CM

## 2012-06-18 ENCOUNTER — Encounter: Payer: Self-pay | Admitting: Internal Medicine

## 2012-06-18 ENCOUNTER — Ambulatory Visit (INDEPENDENT_AMBULATORY_CARE_PROVIDER_SITE_OTHER): Payer: BC Managed Care – PPO | Admitting: Internal Medicine

## 2012-06-18 VITALS — BP 136/76 | HR 106 | Temp 98.6°F | Wt 278.0 lb

## 2012-06-18 DIAGNOSIS — H6690 Otitis media, unspecified, unspecified ear: Secondary | ICD-10-CM

## 2012-06-18 DIAGNOSIS — H6691 Otitis media, unspecified, right ear: Secondary | ICD-10-CM

## 2012-06-18 DIAGNOSIS — J069 Acute upper respiratory infection, unspecified: Secondary | ICD-10-CM

## 2012-06-18 NOTE — Patient Instructions (Addendum)
Take Augmentin 500 mg 3 times daily for 10 days. Take Hycodan sparingly for cough. Call if not better in 7-10 days or sooner if worse
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
## 2012-08-16 ENCOUNTER — Other Ambulatory Visit: Payer: BC Managed Care – PPO | Admitting: Internal Medicine

## 2012-08-16 DIAGNOSIS — E039 Hypothyroidism, unspecified: Secondary | ICD-10-CM

## 2012-08-16 DIAGNOSIS — Z0000 Encounter for general adult medical examination without abnormal findings: Secondary | ICD-10-CM

## 2012-08-16 LAB — CBC WITH DIFFERENTIAL/PLATELET
Basophils Absolute: 0 K/uL (ref 0.0–0.1)
Basophils Relative: 0 % (ref 0–1)
Eosinophils Absolute: 0.1 K/uL (ref 0.0–0.7)
Eosinophils Relative: 1 % (ref 0–5)
HCT: 37.7 % (ref 36.0–46.0)
Hemoglobin: 12.6 g/dL (ref 12.0–15.0)
Lymphocytes Relative: 30 % (ref 12–46)
Lymphs Abs: 3.1 K/uL (ref 0.7–4.0)
MCH: 29.4 pg (ref 26.0–34.0)
MCHC: 33.4 g/dL (ref 30.0–36.0)
MCV: 87.9 fL (ref 78.0–100.0)
Monocytes Absolute: 0.6 K/uL (ref 0.1–1.0)
Monocytes Relative: 6 % (ref 3–12)
Neutro Abs: 6.6 K/uL (ref 1.7–7.7)
Neutrophils Relative %: 63 % (ref 43–77)
Platelets: 361 K/uL (ref 150–400)
RBC: 4.29 MIL/uL (ref 3.87–5.11)
RDW: 14.1 % (ref 11.5–15.5)
WBC: 10.4 K/uL (ref 4.0–10.5)

## 2012-08-16 LAB — COMPREHENSIVE METABOLIC PANEL WITH GFR
ALT: 12 U/L (ref 0–35)
AST: 13 U/L (ref 0–37)
Albumin: 4.1 g/dL (ref 3.5–5.2)
Alkaline Phosphatase: 53 U/L (ref 39–117)
BUN: 8 mg/dL (ref 6–23)
CO2: 28 meq/L (ref 19–32)
Calcium: 9.3 mg/dL (ref 8.4–10.5)
Chloride: 104 meq/L (ref 96–112)
Creat: 0.83 mg/dL (ref 0.50–1.10)
Glucose, Bld: 76 mg/dL (ref 70–99)
Potassium: 4.1 meq/L (ref 3.5–5.3)
Sodium: 140 meq/L (ref 135–145)
Total Bilirubin: 0.4 mg/dL (ref 0.3–1.2)
Total Protein: 6.4 g/dL (ref 6.0–8.3)

## 2012-08-16 LAB — LIPID PANEL
Cholesterol: 137 mg/dL (ref 0–200)
HDL: 33 mg/dL — ABNORMAL LOW (ref >39)
Triglycerides: 185 mg/dL — ABNORMAL HIGH (ref <150)

## 2012-08-19 ENCOUNTER — Ambulatory Visit (INDEPENDENT_AMBULATORY_CARE_PROVIDER_SITE_OTHER): Payer: BC Managed Care – PPO | Admitting: Internal Medicine

## 2012-08-19 ENCOUNTER — Encounter: Payer: Self-pay | Admitting: Internal Medicine

## 2012-08-19 VITALS — BP 122/84 | HR 80 | Temp 98.7°F | Ht 68.0 in | Wt 271.0 lb

## 2012-08-19 DIAGNOSIS — E282 Polycystic ovarian syndrome: Secondary | ICD-10-CM

## 2012-08-19 DIAGNOSIS — E669 Obesity, unspecified: Secondary | ICD-10-CM

## 2012-08-19 DIAGNOSIS — Z8669 Personal history of other diseases of the nervous system and sense organs: Secondary | ICD-10-CM

## 2012-08-19 DIAGNOSIS — J069 Acute upper respiratory infection, unspecified: Secondary | ICD-10-CM

## 2012-08-19 DIAGNOSIS — E785 Hyperlipidemia, unspecified: Secondary | ICD-10-CM

## 2012-08-19 DIAGNOSIS — E039 Hypothyroidism, unspecified: Secondary | ICD-10-CM

## 2012-09-30 ENCOUNTER — Encounter: Payer: Self-pay | Admitting: Internal Medicine

## 2012-09-30 ENCOUNTER — Ambulatory Visit (INDEPENDENT_AMBULATORY_CARE_PROVIDER_SITE_OTHER): Payer: BC Managed Care – PPO | Admitting: Internal Medicine

## 2012-09-30 VITALS — BP 116/80 | HR 80 | Temp 98.4°F | Wt 290.0 lb

## 2012-09-30 DIAGNOSIS — J069 Acute upper respiratory infection, unspecified: Secondary | ICD-10-CM

## 2012-09-30 DIAGNOSIS — K120 Recurrent oral aphthae: Secondary | ICD-10-CM
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
## 2012-09-30 NOTE — Patient Instructions (Addendum)
Use Magic mouthwash 2 teaspoons by mouth 4 times a day swish but do not swallow until ulcer heals. Hydrocodone/APAP 5/325 one every 6-8 hours as needed for pain. Avoid citrus foods and drinks until ulcer heals. Call if not better in 10 days.

## 2012-09-30 NOTE — Addendum Note (Signed)
Addended by: Judy Pimple on: 09/30/2012 11:59 AM   Modules accepted: Orders

## 2012-09-30 NOTE — Progress Notes (Signed)
  Subjective:    Patient ID: Dawn Navarro, female    DOB: [birth year], 27 y.o.   MRN: [id number]  HPI    Review of Systems     Objective:   Physical Exam        Assessment & Plan:

## 2012-10-23 ENCOUNTER — Other Ambulatory Visit: Payer: Self-pay

## 2012-10-23 MED ORDER — BUTALBITAL-APAP-CAFFEINE 50-325-40 MG PO TABS: 1.0000 | ORAL_TABLET | Freq: Four times a day (QID) | ORAL | Status: DC | PRN

## 2012-11-29 ENCOUNTER — Encounter: Payer: Self-pay | Admitting: Internal Medicine

## 2012-11-29 ENCOUNTER — Ambulatory Visit (INDEPENDENT_AMBULATORY_CARE_PROVIDER_SITE_OTHER): Payer: 59 | Admitting: Internal Medicine

## 2012-11-29 ENCOUNTER — Other Ambulatory Visit: Payer: Self-pay | Admitting: Internal Medicine

## 2012-11-29 VITALS — BP 122/78 | Temp 98.8°F | Wt 291.0 lb

## 2012-11-29 DIAGNOSIS — Z1388 Encounter for screening for disorder due to exposure to contaminants: Secondary | ICD-10-CM

## 2012-12-09 NOTE — Patient Instructions (Addendum)
erythrocyte protoporphyrin level drawn and results are pending

## 2012-12-09 NOTE — Progress Notes (Signed)
  Subjective:    Patient ID: Dawn Navarro, female    DOB: 1986-01-11, 27 y.o.   MRN: [id number]  HPI Patient wants to be tested for lead toxicity. Says daughter has been tested and has higher than normal lead level. They do not live in an old home and do not exposure to old paint.   Review of Systems     Objective:   Physical Exam not examined        Assessment & Plan:  Screening for lead toxicity  Plan: Erythrocyte protoporphyrin level drawn.

## 2013-02-06 ENCOUNTER — Ambulatory Visit (INDEPENDENT_AMBULATORY_CARE_PROVIDER_SITE_OTHER): Payer: 59 | Admitting: Internal Medicine

## 2013-02-06 ENCOUNTER — Encounter: Payer: Self-pay | Admitting: Internal Medicine

## 2013-02-06 VITALS — BP 110/76 | HR 84 | Temp 99.0°F | Wt 282.0 lb

## 2013-02-06 DIAGNOSIS — H6693 Otitis media, unspecified, bilateral: Secondary | ICD-10-CM

## 2013-02-06 DIAGNOSIS — H6690 Otitis media, unspecified, unspecified ear: Secondary | ICD-10-CM

## 2013-02-06 NOTE — Progress Notes (Signed)
  Subjective:    Patient ID: Dawn Navarro, female    DOB: 1986/03/26, 27 y.o.   MRN: [id number]  HPI  27 year old white female who last had a menstrual period 01/01/2013. Recently took a home pregnancy test that was positive. This will be her second pregnancy. She was at the beach last week and came down with an ear infection. Was prescribed some ear drops containing neomycin. She is afraid to take these because she understands and may not be indicated with pregnancy. She's complaining of ear pain. Has had URI symptoms. No fever or shaking chills.      Review of Systems     Objective:   Physical Exam pharynx is slightly injected. No exudate. Right TM is dull and red. Left TM dull. Neck is supple without significant adenopathy. Chest clear.        Assessment & Plan:  Bilateral otitis media  URI  Pregnancy-28 weeks by dates  Plan: Amoxicillin 500 mg 3 times daily for 7 days. Has appointment to see an obstetrician in the very near future. Counseled regarding appropriate over-the-counter medications for pregnancy including Tylenol, Robitussin, Sudafed.

## 2013-02-06 NOTE — Patient Instructions (Addendum)
Take Amoxicillin 500 mg tid x 7 days

## 2013-02-08 DIAGNOSIS — E785 Hyperlipidemia, unspecified: Secondary | ICD-10-CM | POA: Insufficient documentation

## 2013-02-08 NOTE — Patient Instructions (Addendum)
Take antibiotics for acute URI. Stop Lipitor 3 months before conceiving. Return in 6 months. Continue same dose of thyroid replacement.

## 2013-02-08 NOTE — Progress Notes (Signed)
  Subjective:    Patient ID: Dawn Navarro, female    DOB: 1985/10/23, 27 y.o.   MRN: [id number]  HPI 27 year old White with history of polycystic ovary syndrome, history of migraine headaches, hypothyroidism, obesity, hyperlipidemia in today for health maintenance and evaluation of medical problems. Has not had any migraine headaches and over months. Recently has had nausea and vomiting with diarrhea. Likely had viral syndrome. Now has come down with upper respiratory infection. Patient wants to be pregnant in June 2014. She currently is on generic Lipitor. She should stop this 3 months in advance of conceiving.  First baby was delivered by cesarean section in February 2012. She took Clomid to get pregnant. C-section was done for failure to progress.  She has been on thyroid replacement therapy since around 2010.  Social history: She is married. Has a 4 year college degree. She works as an Scientist, water quality for [employer], [employer]. Does not smoke or consume alcohol. [place]  History of irregular menses. GYN care done through [facility]. In March 2012 had Pap smear with ASCUS.  Family history: Mother with history of hypothyroidism. Father with history of non-Hodgkin's lymphoma and remote history of prostate cancer. 2 brothers in good health.    Review of Systems  Constitutional: Negative.   HENT: Positive for congestion.   Eyes: Negative.   Respiratory: Positive for cough.   Endocrine:       History of hypothyroidism  Genitourinary:       History of polycystic ovary syndrome and irregular menses  Allergic/Immunologic: Negative.   Neurological: Negative.   Hematological: Negative.   Psychiatric/Behavioral: Negative.        Objective:   Physical Exam  Vitals reviewed. Constitutional: She is oriented to person, place, and time. She appears well-developed and well-nourished. No distress.  HENT:  Head: Normocephalic and atraumatic.  Left Ear: External ear  normal.  Mouth/Throat: No oropharyngeal exudate.  Eyes: Conjunctivae and EOM are normal. Right eye exhibits no discharge. Left eye exhibits no discharge. No scleral icterus.  Neck: Neck supple. No JVD present. No thyromegaly present.  Cardiovascular: Normal rate, regular rhythm, normal heart sounds and intact distal pulses.   Pulmonary/Chest: Effort normal and breath sounds normal. No respiratory distress. She has no wheezes. She has no rales. She exhibits no tenderness.  Breasts normal female  Abdominal: Soft. Bowel sounds are normal. She exhibits no distension and no mass. There is no tenderness. There is no rebound and no guarding.  Genitourinary:  Deferred to GYN  Musculoskeletal: Normal range of motion. She exhibits no edema.  Lymphadenopathy:    She has no cervical adenopathy.  Neurological: She is alert and oriented to person, place, and time. She has normal reflexes. She displays normal reflexes. No cranial nerve deficit. Coordination normal.  Skin: Skin is warm and dry. No rash noted. She is not diaphoretic.  Psychiatric: She has a normal mood and affect. Her behavior is normal. Judgment and thought content normal.          Assessment & Plan:  Hyperlipidemia. Patient wants to conceive in June. Should be off Lipitor for 3 months before conceiving.  History of polycystic ovary syndrome-followed by GYN  Hypothyroidism-continue current dose of Synthroid  History of migraine headaches  Recent viral syndrome/gastroenteritis now with acute upper respiratory infection. Treat with short course of antibiotics for respiratory infection.

## 2013-03-05 ENCOUNTER — Other Ambulatory Visit: Payer: Self-pay | Admitting: Obstetrics and Gynecology

## 2013-03-05 LAB — OB RESULTS CONSOLE RUBELLA ANTIBODY, IGM: Rubella: IMMUNE

## 2013-03-05 LAB — OB RESULTS CONSOLE RPR: RPR: NONREACTIVE

## 2013-03-05 LAB — OB RESULTS CONSOLE ANTIBODY SCREEN: ANTIBODY SCREEN: NEGATIVE

## 2013-03-05 LAB — OB RESULTS CONSOLE GC/CHLAMYDIA
Chlamydia: NEGATIVE
Gonorrhea: NEGATIVE

## 2013-03-05 LAB — OB RESULTS CONSOLE ABO/RH: RH Type: POSITIVE

## 2013-03-05 LAB — OB RESULTS CONSOLE HEPATITIS B SURFACE ANTIGEN: HEP B S AG: NEGATIVE

## 2013-03-05 LAB — OB RESULTS CONSOLE HIV ANTIBODY (ROUTINE TESTING): HIV: NONREACTIVE

## 2013-05-01 ENCOUNTER — Other Ambulatory Visit: Payer: Self-pay

## 2013-05-15 ENCOUNTER — Ambulatory Visit: Payer: 59 | Admitting: Internal Medicine

## 2013-05-15 ENCOUNTER — Ambulatory Visit (INDEPENDENT_AMBULATORY_CARE_PROVIDER_SITE_OTHER): Payer: 59 | Admitting: Internal Medicine

## 2013-05-15 ENCOUNTER — Encounter: Payer: Self-pay | Admitting: Internal Medicine

## 2013-05-15 VITALS — BP 124/82 | HR 96 | Temp 98.5°F | Wt 289.0 lb

## 2013-05-15 DIAGNOSIS — J209 Acute bronchitis, unspecified: Secondary | ICD-10-CM

## 2013-05-15 MED ORDER — ALBUTEROL SULFATE HFA 108 (90 BASE) MCG/ACT IN AERS: 2.0000 | INHALATION_SPRAY | Freq: Four times a day (QID) | RESPIRATORY_TRACT | Status: DC | PRN

## 2013-05-15 MED ORDER — AZITHROMYCIN 250 MG PO TABS: ORAL_TABLET | ORAL | Status: DC

## 2013-05-15 NOTE — Progress Notes (Signed)
  Subjective:    Patient ID: Cherylynn Ridges, female    DOB: 1986/04/06, 27 y.o.   MRN: [id number]  HPI  27 year old White female recently been on Amoxicillin for UTI a month ago, and was placed on Z-pak for exposure to whooping cough about 3 weeks ago. Then, developed less than a week after finishing antibiotics a URI now with discolored sputum and nasal drainage. No fever or chills. Some wheezing. A lot of coughing not responding to Robitussin. Daughter had similar illness 2 days before patient had symptoms. Pt is [redacted] weeks pregnant. Coughing a lot.    Review of Systems     Objective:   Physical Exam Skin warm and dry; Nodes none; Chest clear to auscultation without rales or wheezing. Neck supple.       Assessment & Plan:  14 weeks IUP with 2 previous rounds of antibiotics for different reasons recently-- see above  Zithromax 2 po day 1 then one po days 2-5.  Albuterol inhaler 2 sprays po qid prn wheexing with 2 refills. Call if not better in 10 days or sooner if worse.  Dx: Bronchitis

## 2013-05-15 NOTE — Patient Instructions (Signed)
Take Z-pak as directed. Use inhaler 2 sprays 4 times a day as needed.

## 2013-08-30 ENCOUNTER — Inpatient Hospital Stay (HOSPITAL_COMMUNITY)
Admission: AD | Admit: 2013-08-30 | Discharge: 2013-08-30 | Disposition: A | Discharge status: Home / Self Care | Payer: BC Managed Care – PPO | Source: Ambulatory Visit | Attending: Obstetrics and Gynecology | Admitting: Obstetrics and Gynecology

## 2013-08-30 ENCOUNTER — Encounter (HOSPITAL_COMMUNITY): Payer: Self-pay | Admitting: *Deleted

## 2013-08-30 DIAGNOSIS — O3660X0 Maternal care for excessive fetal growth, unspecified trimester, not applicable or unspecified: Secondary | ICD-10-CM | POA: Insufficient documentation

## 2013-08-30 DIAGNOSIS — Z3689 Encounter for other specified antenatal screening: Secondary | ICD-10-CM

## 2013-08-30 DIAGNOSIS — O479 False labor, unspecified: Secondary | ICD-10-CM

## 2013-08-30 DIAGNOSIS — O4700 False labor before 37 completed weeks of gestation, unspecified trimester: Secondary | ICD-10-CM | POA: Insufficient documentation

## 2013-08-30 DIAGNOSIS — O368190 Decreased fetal movements, unspecified trimester, not applicable or unspecified: Secondary | ICD-10-CM | POA: Insufficient documentation

## 2013-08-30 HISTORY — DX: Hypothyroidism, unspecified: E03.9

## 2013-08-30 LAB — URINALYSIS, ROUTINE W REFLEX MICROSCOPIC
Bilirubin Urine: NEGATIVE
Glucose, UA: NEGATIVE mg/dL
Hgb urine dipstick: NEGATIVE
KETONES UR: NEGATIVE mg/dL
LEUKOCYTES UA: NEGATIVE
NITRITE: NEGATIVE
PROTEIN: NEGATIVE mg/dL
Specific Gravity, Urine: 1.02 (ref 1.005–1.030)
UROBILINOGEN UA: 0.2 mg/dL (ref 0.0–1.0)
pH: 6.5 (ref 5.0–8.0)

## 2013-08-30 NOTE — MAU Note (Signed)
Decrease fetal movement through the day, even after eating, drinking and laying on left side.

## 2013-08-30 NOTE — MAU Provider Note (Signed)
History     CSN: [id number]  Arrival date and time: 08/30/13 2150   First Provider Initiated Contact with Patient 08/30/13 2214      Chief Complaint  Patient presents with  . Decreased Fetal Movement   HPI  Pt is 28 yo G2P1001 at [redacted]w[redacted]d weeks IUP here with report of decreased fetal movement.  Reports feeling decreased movement this morning and did not respond after eating or drinking.  Total movement felt today has been approximately 10 times since about 0830 this morning.  Denies contractions or vaginal bleeding.  Currently began fetal testing due to LGA diagnosed in past week.  Patient reports negative gestational diabetes screen.    Past Medical History  Diagnosis Date  . Bronchitis   . Thyroid disease     hypothyroidism  . Anemia   . Hypothyroidism     Past Surgical History  Procedure Laterality Date  . Cesarean section      Family History  Problem Relation Age of Onset  . Cancer Father     History  Substance Use Topics  . Smoking status: Never Smoker   . Smokeless tobacco: Never Used  . Alcohol Use: No    Allergies:  Allergies  Allergen Reactions  . Latex Rash    Prescriptions prior to admission  Medication Sig Dispense Refill  . docusate sodium (COLACE) 100 MG capsule Take 100 mg by mouth daily.      . IRON PO Take 150 mg by mouth daily.      Marland Kitchen levothyroxine (SYNTHROID, LEVOTHROID) 100 MCG tablet Take 100 mcg by mouth daily.        . Prenatal Vitamins (DIS) TABS Take by mouth.        Marland Kitchen albuterol (PROVENTIL HFA;VENTOLIN HFA) 108 (90 BASE) MCG/ACT inhaler Inhale 2 puffs into the lungs every 6 (six) hours as needed for wheezing.  1 Inhaler  2  . azithromycin (ZITHROMAX Z-PAK) 250 MG tablet Take 2 tablets (500 mg) on  Day 1,  followed by 1 tablet (250 mg) once daily on Days 2 through 5.  6 each  1  . butalbital-acetaminophen-caffeine (FIORICET, ESGIC) 50-325-40 MG per tablet Take 1 tablet by mouth every 6 (six) hours as needed for headache.  40 tablet  0  .  cyclobenzaprine (FLEXERIL) 10 MG tablet Take 1 tablet (10 mg total) by mouth 90 (ninety) minutes before procedure.  30 tablet  0  . guaifenesin (ROBITUSSIN) 100 MG/5ML syrup Take 200 mg by mouth 3 (three) times daily as needed for cough.      Marland Kitchen HYDROcodone-acetaminophen (VICODIN) 5-500 MG per tablet Take 1 tablet by mouth every 6 (six) hours as needed.      Marland Kitchen levonorgestrel-ethinyl estradiol (NORDETTE) 0.15-30 MG-MCG tablet Take 1 tablet by mouth daily.          Review of Systems  Constitutional:       Decreased fetal movement  Eyes: Negative for blurred vision and double vision.  Gastrointestinal: Positive for abdominal pain.  Neurological: Positive for headaches.  All other systems reviewed and are negative.   Physical Exam   Blood pressure 127/82, pulse 115, temperature 98.2 F (36.8 C), temperature source Oral, resp. rate 18, height 5\' 8"  (1.727 m), weight 136.079 kg (300 lb), last menstrual period 01/01/2013.  Physical Exam  Constitutional: She is oriented to person, place, and time. She appears well-developed and well-nourished.  HENT:  Head: Normocephalic.  Neck: Normal range of motion. Neck supple.  Cardiovascular: Normal rate, regular rhythm and  normal heart sounds.   Respiratory: Effort normal and breath sounds normal.  GI: Soft. There is no tenderness.  Genitourinary: No bleeding around the vagina.  Musculoskeletal: Normal range of motion. She exhibits no edema.  Neurological: She is alert and oriented to person, place, and time.  Skin: Skin is warm and dry.   FHR 150's, +accels Toco - 5-6 min/60-70 MAU Course  Procedures  2220 Consulted with Dr. Tenny Crawoss > Reviewed HPI/Exam > discharge to home if NST reactive 2245 Phoned Dr. Tenny Crawoss reviewed NST (Category I FHR tracing), irregular contractions (5-6 min, not felt by patient), cervical exam (closed) > discharge home and keep scheduled appointment.    Assessment and Plan  28 yo G2P1001 at 5327w0d wks IUP Category I FHR  Tracing Braxton Hick's  Plan: Discharge home Preterm labor precautions  Monitor Fetal movement Keep scheduled appointment  [facility] Center[facility]UH[facility],Dawn Navarro 08/30/2013, 10:50 PM

## 2013-09-05 ENCOUNTER — Other Ambulatory Visit (HOSPITAL_COMMUNITY): Payer: Self-pay | Admitting: Obstetrics and Gynecology

## 2013-09-05 ENCOUNTER — Encounter (HOSPITAL_COMMUNITY): Payer: Self-pay | Admitting: Obstetrics and Gynecology

## 2013-09-05 DIAGNOSIS — O289 Unspecified abnormal findings on antenatal screening of mother: Secondary | ICD-10-CM

## 2013-09-05 DIAGNOSIS — O409XX0 Polyhydramnios, unspecified trimester, not applicable or unspecified: Secondary | ICD-10-CM

## 2013-09-09 ENCOUNTER — Encounter (HOSPITAL_COMMUNITY): Payer: Self-pay

## 2013-09-09 ENCOUNTER — Ambulatory Visit (HOSPITAL_COMMUNITY)
Admission: RE | Admit: 2013-09-09 | Discharge: 2013-09-09 | Disposition: A | Discharge status: Home / Self Care | Payer: BC Managed Care – PPO | Source: Ambulatory Visit | Attending: Obstetrics and Gynecology | Admitting: Obstetrics and Gynecology

## 2013-09-09 ENCOUNTER — Other Ambulatory Visit (HOSPITAL_COMMUNITY): Payer: Self-pay | Admitting: Obstetrics and Gynecology

## 2013-09-09 DIAGNOSIS — O99280 Endocrine, nutritional and metabolic diseases complicating pregnancy, unspecified trimester: Secondary | ICD-10-CM

## 2013-09-09 DIAGNOSIS — O403XX0 Polyhydramnios, third trimester, not applicable or unspecified: Secondary | ICD-10-CM

## 2013-09-09 DIAGNOSIS — O99210 Obesity complicating pregnancy, unspecified trimester: Secondary | ICD-10-CM

## 2013-09-09 DIAGNOSIS — O409XX0 Polyhydramnios, unspecified trimester, not applicable or unspecified: Secondary | ICD-10-CM

## 2013-09-09 DIAGNOSIS — O289 Unspecified abnormal findings on antenatal screening of mother: Secondary | ICD-10-CM

## 2013-09-09 DIAGNOSIS — Z3689 Encounter for other specified antenatal screening: Secondary | ICD-10-CM | POA: Insufficient documentation

## 2013-09-09 DIAGNOSIS — E079 Disorder of thyroid, unspecified: Secondary | ICD-10-CM | POA: Insufficient documentation

## 2013-09-09 DIAGNOSIS — E039 Hypothyroidism, unspecified: Secondary | ICD-10-CM | POA: Insufficient documentation

## 2013-09-09 NOTE — Consult Note (Signed)
MFM consult  28 yr old G2P1001 at 5780w3d with polyhydramnios and morbid obesity referred by Dr. Henderson CloudHorvath for fetal ultrasound and consult.  Ultrasound today shows: single intrauterine pregnancy. Estimated fetal weight is in the 89th%. Posterior placenta without evidence of previa. Polyhydramnios with AFI of 27cm. The limited anatomy survey is normal. Anatomy survey is extremely limited by maternal body habitus and advanced gestational age. A normal stomach is seen. Normal fetal movement is seen. No evidence of hydrops.  I counseled the patient as follows: 1. Appropriate fetal growth although accelerated. - patient plans delivery via repeat C section - normal glucola 2. Normal limited anatomy survey 3. Polyhydramnios: - Discussed the most common etiology is idiopathic- 60% of cases. Other possible etiologies include: diabetes (patient had normal one hour glucola; hgb A1C is pending), fetal malformations, genetic abnormalities, fetal neuromuscular disorders, fetal anemia, hydrops (immune or nonimmune), or congenital fetal infection. I discussed that the possibility of genetic abnormalities are low with otherwise a normal anatomy- and patient reports had normal cell free fetal DNA screen- we do not have these results. There is no evidence of a gastrointestinal malformation- the fetus has a normal stomach indicated swallowing is normal. There is normal fetal movement which makes a neuromuscular disorder less likely. There are no other signs of fetal anemia or hydrops. There are no other fluid collections. Given the above this is most likely idiopathic polyhydramnios or undiagnosed gestational diabetes given morbid obesity and accelerated fetal growth. However, the patient has already had TORCH titers drawn to exclude infectious etiology- these results are pending.  I discussed polyhydramnios is associated with increased risk of preterm labor, preterm premature rupture of membranes, preterm delivery,  uterine atony, cord prolapse, fetal malpresentation, fetal macrosomia, and perinatal mortality. I gave the patient preterm labor precautions. Given the increase in perinatal mortality I recommend continuing antenatal testing and weekly AFI.   - recommend delivery at 39-40 weeks or sooner if clinically indicated - recommend fetal kick counts- instructions given - consider repeat fetal growth in 3-4 weeks   I spent a total of 30 minutes with the patient of which >50% was spent in face to face consultation discussing the above.  Please call with questions.  Eulis FosterKristen Lysette Lindenbaum, MD

## 2013-09-09 NOTE — Progress Notes (Signed)
[facility] Ultrasound  Indication: 28 yr old G2P1001 at 6417w3d with polyhydramnios and morbid obesity for fetal ultrasound.  Findings: 1. Single intrauterine pregnancy. 2. Estimated fetal weight is in the 89th%. 3. Posterior placenta without evidence of previa. 4. Polyhydramnios with AFI of 27cm. 5. The limited anatomy survey is normal. Anatomy survey is extremely limited by maternal body habitus and advanced gestational age. 6. A normal stomach is seen. 7. Normal fetal movement is seen. 8. No evidence of hydrops.  Recommendations: 1. Appropriate fetal growth although accelerated. - patient plans delivery via repeat C section - normal glucola 2. Normal limited anatomy survey 3. Polyhydramnios: - see consult letter - recommend continue antenatal testing - recommend delivery at 39-40 weeks or sooner if clinically indicated - recommend fetal kick counts- instructions given - consider repeat fetal growth in 3-4 weeks  Dawn FosterKristen Ottis Vacha, MD

## 2013-09-26 ENCOUNTER — Encounter (HOSPITAL_COMMUNITY): Payer: Self-pay | Admitting: Pharmacist

## 2013-09-27 ENCOUNTER — Encounter (HOSPITAL_COMMUNITY): Payer: Self-pay

## 2013-09-27 ENCOUNTER — Inpatient Hospital Stay (HOSPITAL_COMMUNITY)
Admission: AD | Admit: 2013-09-27 | Discharge: 2013-09-27 | Disposition: A | Discharge status: Home / Self Care | Payer: BC Managed Care – PPO | Source: Ambulatory Visit | Attending: Obstetrics and Gynecology | Admitting: Obstetrics and Gynecology

## 2013-09-27 DIAGNOSIS — R51 Headache: Secondary | ICD-10-CM | POA: Insufficient documentation

## 2013-09-27 DIAGNOSIS — O99891 Other specified diseases and conditions complicating pregnancy: Secondary | ICD-10-CM | POA: Insufficient documentation

## 2013-09-27 DIAGNOSIS — R030 Elevated blood-pressure reading, without diagnosis of hypertension: Secondary | ICD-10-CM | POA: Insufficient documentation

## 2013-09-27 DIAGNOSIS — O9989 Other specified diseases and conditions complicating pregnancy, childbirth and the puerperium: Principal | ICD-10-CM

## 2013-09-27 DIAGNOSIS — H538 Other visual disturbances: Secondary | ICD-10-CM | POA: Insufficient documentation

## 2013-09-27 LAB — COMPREHENSIVE METABOLIC PANEL
ALK PHOS: 103 U/L (ref 39–117)
ALT: 13 U/L (ref 0–35)
AST: 14 U/L (ref 0–37)
Albumin: 2.5 g/dL — ABNORMAL LOW (ref 3.5–5.2)
BUN: 4 mg/dL — AB (ref 6–23)
CHLORIDE: 102 meq/L (ref 96–112)
CO2: 24 mEq/L (ref 19–32)
Calcium: 9.2 mg/dL (ref 8.4–10.5)
Creatinine, Ser: 0.54 mg/dL (ref 0.50–1.10)
GFR calc non Af Amer: >90 mL/min (ref >90)
GLUCOSE: 91 mg/dL (ref 70–99)
POTASSIUM: 3.8 meq/L (ref 3.7–5.3)
Sodium: 138 mEq/L (ref 137–147)
Total Protein: 6.1 g/dL (ref 6.0–8.3)

## 2013-09-27 LAB — CBC
HEMATOCRIT: 34.8 % — AB (ref 36.0–46.0)
Hemoglobin: 11.6 g/dL — ABNORMAL LOW (ref 12.0–15.0)
MCH: 29.6 pg (ref 26.0–34.0)
MCHC: 33.3 g/dL (ref 30.0–36.0)
MCV: 88.8 fL (ref 78.0–100.0)
Platelets: 250 10*3/uL (ref 150–400)
RBC: 3.92 MIL/uL (ref 3.87–5.11)
RDW: 15.1 % (ref 11.5–15.5)
WBC: 9.8 10*3/uL (ref 4.0–10.5)

## 2013-09-27 LAB — URINALYSIS, ROUTINE W REFLEX MICROSCOPIC
Bilirubin Urine: NEGATIVE
GLUCOSE, UA: NEGATIVE mg/dL
Hgb urine dipstick: NEGATIVE
KETONES UR: NEGATIVE mg/dL
LEUKOCYTES UA: NEGATIVE
Nitrite: NEGATIVE
Protein, ur: NEGATIVE mg/dL
Specific Gravity, Urine: 1.015 (ref 1.005–1.030)
Urobilinogen, UA: 0.2 mg/dL (ref 0.0–1.0)
pH: 6 (ref 5.0–8.0)

## 2013-09-27 LAB — LACTATE DEHYDROGENASE: LDH: 130 U/L (ref 94–250)

## 2013-09-27 LAB — URIC ACID: URIC ACID, SERUM: 5.4 mg/dL (ref 2.4–7.0)

## 2013-09-27 NOTE — MAU Note (Signed)
Have just felt "off" today. Have slight h/a and alittle blurry vision. My mom is cardiac nurse and took my b/p maually. 140/92 R and 142/98L. My B/P was elevated Thurs in office. No protein in urine then.

## 2013-09-27 NOTE — Discharge Instructions (Signed)
Collect a 24 hour urine for Protein and return urine bottle into the office on Monday.  Keep bottle refrigerated.   Fetal Movement Counts Patient Name: __________________________________________________ Patient Due Date: ____________________ Performing a fetal movement count is highly recommended in high-risk pregnancies, but it is good for every pregnant woman to do. Your caregiver may ask you to start counting fetal movements at 28 weeks of the pregnancy. Fetal movements often increase:  After eating a full meal.  After physical activity.  After eating or drinking something sweet or cold.  At rest. Pay attention to when you feel the baby is most active. This will help you notice a pattern of your baby's sleep and wake cycles and what factors contribute to an increase in fetal movement. It is important to perform a fetal movement count at the same time each day when your baby is normally most active.  HOW TO COUNT FETAL MOVEMENTS 1. Find a quiet and comfortable area to sit or lie down on your left side. Lying on your left side provides the best blood and oxygen circulation to your baby. 2. Write down the day and time on a sheet of paper or in a journal. 3. Start counting kicks, flutters, swishes, rolls, or jabs in a 2 hour period. You should feel at least 10 movements within 2 hours. 4. If you do not feel 10 movements in 2 hours, wait 2 3 hours and count again. Look for a change in the pattern or not enough counts in 2 hours. SEEK MEDICAL CARE IF:  You feel less than 10 counts in 2 hours, tried twice.  There is no movement in over an hour.  The pattern is changing or taking longer each day to reach 10 counts in 2 hours.  You feel the baby is not moving as he or she usually does. Date: ____________ Movements: ____________ Start time: ____________ Dawn Navarro time: ____________  Date: ____________ Movements: ____________ Start time: ____________ Dawn Navarro time: ____________ Date: ____________  Movements: ____________ Start time: ____________ Dawn Navarro time: ____________ Date: ____________ Movements: ____________ Start time: ____________ Dawn Navarro time: ____________ Date: ____________ Movements: ____________ Start time: ____________ Dawn Navarro time: ____________ Date: ____________ Movements: ____________ Start time: ____________ Dawn Navarro time: ____________ Date: ____________ Movements: ____________ Start time: ____________ Dawn Navarro time: ____________ Date: ____________ Movements: ____________ Start time: ____________ Dawn Navarro time: ____________  Date: ____________ Movements: ____________ Start time: ____________ Dawn Navarro time: ____________ Date: ____________ Movements: ____________ Start time: ____________ Dawn Navarro time: ____________ Date: ____________ Movements: ____________ Start time: ____________ Dawn Navarro time: ____________ Date: ____________ Movements: ____________ Start time: ____________ Dawn Navarro time: ____________ Date: ____________ Movements: ____________ Start time: ____________ Dawn Navarro time: ____________ Date: ____________ Movements: ____________ Start time: ____________ Dawn Navarro time: ____________ Date: ____________ Movements: ____________ Start time: ____________ Dawn Navarro time: ____________  Date: ____________ Movements: ____________ Start time: ____________ Dawn Navarro time: ____________ Date: ____________ Movements: ____________ Start time: ____________ Dawn Navarro time: ____________ Date: ____________ Movements: ____________ Start time: ____________ Dawn Navarro time: ____________ Date: ____________ Movements: ____________ Start time: ____________ Dawn Navarro time: ____________ Date: ____________ Movements: ____________ Start time: ____________ Dawn Navarro time: ____________ Date: ____________ Movements: ____________ Start time: ____________ Dawn Navarro time: ____________ Date: ____________ Movements: ____________ Start time: ____________ Dawn Navarro time: ____________  Date: ____________ Movements: ____________ Start time:  ____________ Dawn Navarro time: ____________ Date: ____________ Movements: ____________ Start time: ____________ Dawn Navarro time: ____________ Date: ____________ Movements: ____________ Start time: ____________ Dawn Navarro time: ____________ Date: ____________ Movements: ____________ Start time: ____________ Dawn Navarro time: ____________ Date: ____________ Movements: ____________ Start time: ____________ Dawn Navarro time: ____________ Date: ____________ Movements: ____________ Start time: ____________  Finish time: ____________ Date: ____________ Movements: ____________ Start time: ____________ Dawn Navarro time: ____________  Date: ____________ Movements: ____________ Start time: ____________ Dawn Navarro time: ____________ Date: ____________ Movements: ____________ Start time: ____________ Dawn Navarro time: ____________ Date: ____________ Movements: ____________ Start time: ____________ Dawn Navarro time: ____________ Date: ____________ Movements: ____________ Start time: ____________ Dawn Navarro time: ____________ Date: ____________ Movements: ____________ Start time: ____________ Dawn Navarro time: ____________ Date: ____________ Movements: ____________ Start time: ____________ Dawn Navarro time: ____________ Date: ____________ Movements: ____________ Start time: ____________ Dawn Navarro time: ____________  Date: ____________ Movements: ____________ Start time: ____________ Dawn Navarro time: ____________ Date: ____________ Movements: ____________ Start time: ____________ Dawn Navarro time: ____________ Date: ____________ Movements: ____________ Start time: ____________ Dawn Navarro time: ____________ Date: ____________ Movements: ____________ Start time: ____________ Dawn Navarro time: ____________ Date: ____________ Movements: ____________ Start time: ____________ Dawn Navarro time: ____________ Date: ____________ Movements: ____________ Start time: ____________ Dawn Navarro time: ____________ Date: ____________ Movements: ____________ Start time: ____________ Dawn Navarro time: ____________  Date:  ____________ Movements: ____________ Start time: ____________ Dawn Navarro time: ____________ Date: ____________ Movements: ____________ Start time: ____________ Dawn Navarro time: ____________ Date: ____________ Movements: ____________ Start time: ____________ Dawn Navarro time: ____________ Date: ____________ Movements: ____________ Start time: ____________ Dawn Navarro time: ____________ Date: ____________ Movements: ____________ Start time: ____________ Dawn Navarro time: ____________ Date: ____________ Movements: ____________ Start time: ____________ Dawn Navarro time: ____________ Date: ____________ Movements: ____________ Start time: ____________ Dawn Navarro time: ____________  Date: ____________ Movements: ____________ Start time: ____________ Dawn Navarro time: ____________ Date: ____________ Movements: ____________ Start time: ____________ Dawn Navarro time: ____________ Date: ____________ Movements: ____________ Start time: ____________ Dawn Navarro time: ____________ Date: ____________ Movements: ____________ Start time: ____________ Dawn Navarro time: ____________ Date: ____________ Movements: ____________ Start time: ____________ Dawn Navarro time: ____________ Date: ____________ Movements: ____________ Start time: ____________ Dawn Navarro time: ____________ Document Released: 08/23/2006 Document Revised: 07/10/2012 Document Reviewed: 05/20/2012 [employer] Patient Information 2014 [employer]. Preeclampsia and Eclampsia  Preeclampsia is a condition of high blood pressure during pregnancy. It can happen at 20 weeks or later in pregnancy. If high blood pressure occurs in the second half of pregnancy with no other symptoms, it is called gestational hypertension and goes away after the baby is born. If any of the symptoms listed below develop with gestational hypertension, it is then called preeclampsia. Eclampsia (convulsions) may follow preeclampsia. This is one of the reasons for regular prenatal checkups. Early diagnosis and treatment are very important to  prevent eclampsia. CAUSES  There is no known cause of preeclampsia/eclampsia in pregnancy. There are several known conditions that may put the pregnant woman at risk, such as:  The first pregnancy.  Having preeclampsia in a past pregnancy.  Having lasting (chronic) high blood pressure.  Having multiples (twins, triplets).  Being age 43 or older.  African American ethnic background.  Having kidney disease or diabetes.  Medical conditions such as lupus or blood diseases.  Being overweight (obese). SYMPTOMS   High blood pressure.  Headaches.  Sudden weight gain.  Swelling of hands, face, legs, and feet.  Protein in the urine.  Feeling sick to your stomach (nauseous) and throwing up (vomiting).  Vision problems (blurred or double vision).  Numbness in the face, arms, legs, and feet.  Dizziness.  Slurred speech.  Preeclampsia can cause growth retardation in the fetus.  Separation (abruption) of the placenta.  Not enough fluid in the amniotic sac (oligohydramnios).  Sensitivity to bright lights.  Belly (abdominal) pain. DIAGNOSIS  If protein is found in the urine in the second half of pregnancy, this is considered preeclampsia. Other symptoms mentioned above may also be present. TREATMENT  It is necessary to treat  this.  Your caregiver may prescribe bed rest early in this condition. Plenty of rest and salt restriction may be all that is needed.  Medicines may be necessary to lower blood pressure if the condition does not respond to more conservative measures.  In more severe cases, hospitalization may be needed:  For treatment of blood pressure.  To control fluid retention.  To monitor the baby to see if the condition is causing harm to the baby.  Hospitalization is the best way to treat the first sign of preeclampsia. This is so the mother and baby can be watched closely and blood tests can be done effectively and correctly.  If the condition becomes  severe, it may be necessary to induce labor or to remove the infant by surgical means (cesarean section). The best cure for preeclampsia/eclampsia is to deliver the baby. Preeclampsia and eclampsia involve risks to mother and infant. Your caregiver will discuss these risks with you. Together, you can work out the best possible approach to your problems. Make sure you keep your prenatal visits as scheduled. Not keeping appointments could result in a chronic or permanent injury, pain, disability to you, and death or injury to you or your unborn baby. If there is any problem keeping the appointment, you must call to reschedule. HOME CARE INSTRUCTIONS   Keep your prenatal appointments and tests as scheduled.  Tell your caregiver if you have any of the above risk factors.  Get plenty of rest and sleep.  Eat a balanced diet that is low in salt, and do not add salt to your food.  Avoid stressful situations.  Only take over-the-counter and prescriptions medicines for pain, discomfort, or fever as directed by your caregiver. SEEK IMMEDIATE MEDICAL CARE IF:   You develop severe swelling anywhere in the body. This usually occurs in the legs.  You gain 05 lb/2.3 kg or more in a week.  You develop a severe headache, dizziness, problems with your vision, or confusion.  You have abdominal pain, nausea, or vomiting.  You have a seizure.  You have trouble moving any part of your body, or you develop numbness or problems speaking.  You have bruising or abnormal bleeding from anywhere in the body.  You develop a stiff neck.  You pass out. MAKE SURE YOU:   Understand these instructions.  Will watch your condition.  Will get help right away if you are not doing well or get worse. Document Released: 07/21/2000 Document Revised: 10/16/2011 Document Reviewed: 03/06/2008 [facility] Patient Information 2014 [place], [place]. Hypertension During Pregnancy Hypertension is also called high blood  pressure. It can occur at any time in life and during pregnancy. When you have hypertension, there is extra pressure inside your blood vessels that carry blood from the heart to the rest of your body (arteries). Hypertension during pregnancy can cause problems for you and your baby. Your baby might not weigh as much as it should at birth or might be born early (premature). Very bad cases of hypertension during pregnancy can be life threatening.  Different types of hypertension can occur during pregnancy.   Chronic hypertension. This happens when a woman has hypertension before pregnancy and it continues during pregnancy.  Gestational hypertension. This is when hypertension develops during pregnancy.  Preeclampsia or toxemia of pregnancy. This is a very serious type of hypertension that develops only during pregnancy. It is a disease that affects the whole body (systemic) and can be very dangerous for both mother and baby.  Gestational hypertension  and preeclampsia usually go away after your baby is born. Blood pressure generally stabilizes within 6 weeks. Women who have hypertension during pregnancy have a greater chance of developing hypertension later in life or with future pregnancies. RISK FACTORS Some factors make you more likely to develop hypertension during pregnancy. Risk factors include:  Having hypertension before pregnancy.  Having hypertension during a previous pregnancy.  Being overweight.  Being older than 40.  Being pregnant with more than one baby (multiples).  Having diabetes or kidney problems. SIGNS AND SYMPTOMS Chronic and gestational hypertension rarely cause symptoms. Preeclampsia has symptoms, which may include:  Increased protein in your urine. Your health care provider will check for this at every prenatal visit.  Swelling of your hands and face.  Rapid weight gain.  Headaches.  Visual changes.  Being bothered by light.  Abdominal pain, especially in  the right upper area.  Chest pain.  Shortness of breath.  Increased reflexes.  Seizures. Seizures occur with a more severe form of preeclampsia, called eclampsia. DIAGNOSIS   You may be diagnosed with hypertension during a regular prenatal exam. At each visit, tests may include:  Blood pressure checks.  A urine test to check for protein in your urine.  The type of hypertension you are diagnosed with depends on when you developed it. It also depends on your specific blood pressure reading.  Developing hypertension before 20 weeks of pregnancy is consistent with chronic hypertension.  Developing hypertension after 20 weeks of pregnancy is consistent with gestational hypertension.  Hypertension with increased urinary protein is diagnosed as preeclampsia.  Blood pressure measurements that stay above 160 systolic or 110 diastolic are a sign of severe preeclampsia. TREATMENT Treatment for hypertension during pregnancy varies. Treatment depends on the type of hypertension and how serious it is.  If you take medicine for chronic hypertension, you may need to switch medicines.  Drugs called ACE inhibitors should not be taken during pregnancy.  Low-dose aspirin may be suggested for women who have risk factors for preeclampsia.  If you have gestational hypertension, you may need to take a blood pressure medicine that is safe during pregnancy. Your health care provider will recommend the appropriate medicine.  If you have severe preeclampsia, you may need to be in the hospital. Health care providers will watch you and the baby very closely. You also may need to take medicine (magnesium sulfate) to prevent seizures and lower blood pressure.  Sometimes an early delivery is needed. This may be the case if the condition worsens. It would be done to protect you and the baby. The only cure for preeclampsia is delivery. HOME CARE INSTRUCTIONS  Schedule and keep all of your regular appointments  for prenatal care.  Only take over-the-counter or prescription medicines as directed by your health care provider. Tell your health care provider about all medicines you take.  Eat as little salt as possible.  Get regular exercise.  Do not drink alcohol.  Do not use tobacco products.  Do not drink products with caffeine.  Lie on your left side when resting. SEEK IMMEDIATE MEDICAL CARE IF:  You have severe abdominal pain.  You have sudden swelling in the hands, ankles, or face.  You gain 4 pounds (1.8 kg) or more in 1 week.  You vomit repeatedly.  You have vaginal bleeding.  You do not feel the baby moving as much.  You have a headache.  You have blurred or double vision.  You have muscle twitching or spasms.  You have shortness of breath.  You have blue fingernails and lips.  You have blood in your urine. MAKE SURE YOU:  Understand these instructions.  Will watch your condition.  Will get help right away if you are not doing well or get worse. Document Released: 04/11/2011 Document Revised: 05/14/2013 Document Reviewed: 02/20/2013 [facility] Patient Information 2014 [place], [place].

## 2013-09-28 ENCOUNTER — Other Ambulatory Visit: Payer: Self-pay

## 2013-09-28 ENCOUNTER — Encounter (HOSPITAL_COMMUNITY): Payer: Self-pay | Admitting: Family

## 2013-09-28 ENCOUNTER — Inpatient Hospital Stay (HOSPITAL_COMMUNITY)
Admission: AD | Admit: 2013-09-28 | Discharge: 2013-09-28 | Disposition: A | Discharge status: Home / Self Care | Payer: BC Managed Care – PPO | Source: Ambulatory Visit | Attending: Obstetrics and Gynecology | Admitting: Obstetrics and Gynecology

## 2013-09-28 DIAGNOSIS — A084 Viral intestinal infection, unspecified: Secondary | ICD-10-CM

## 2013-09-28 DIAGNOSIS — O99280 Endocrine, nutritional and metabolic diseases complicating pregnancy, unspecified trimester: Secondary | ICD-10-CM

## 2013-09-28 DIAGNOSIS — R1011 Right upper quadrant pain: Secondary | ICD-10-CM | POA: Insufficient documentation

## 2013-09-28 DIAGNOSIS — R197 Diarrhea, unspecified: Secondary | ICD-10-CM | POA: Insufficient documentation

## 2013-09-28 DIAGNOSIS — R51 Headache: Secondary | ICD-10-CM | POA: Insufficient documentation

## 2013-09-28 DIAGNOSIS — O9989 Other specified diseases and conditions complicating pregnancy, childbirth and the puerperium: Principal | ICD-10-CM

## 2013-09-28 DIAGNOSIS — O99891 Other specified diseases and conditions complicating pregnancy: Secondary | ICD-10-CM | POA: Insufficient documentation

## 2013-09-28 DIAGNOSIS — A088 Other specified intestinal infections: Secondary | ICD-10-CM | POA: Insufficient documentation

## 2013-09-28 DIAGNOSIS — E039 Hypothyroidism, unspecified: Secondary | ICD-10-CM | POA: Insufficient documentation

## 2013-09-28 DIAGNOSIS — E079 Disorder of thyroid, unspecified: Secondary | ICD-10-CM | POA: Insufficient documentation

## 2013-09-28 DIAGNOSIS — O479 False labor, unspecified: Secondary | ICD-10-CM | POA: Insufficient documentation

## 2013-09-28 LAB — POCT FERN TEST
POCT Fern Test: NEGATIVE
POCT Fern Test: NEGATIVE

## 2013-09-28 LAB — URINALYSIS, ROUTINE W REFLEX MICROSCOPIC
GLUCOSE, UA: NEGATIVE mg/dL
Hgb urine dipstick: NEGATIVE
LEUKOCYTES UA: NEGATIVE
NITRITE: NEGATIVE
PROTEIN: NEGATIVE mg/dL
Specific Gravity, Urine: >1.03 — ABNORMAL HIGH (ref 1.005–1.030)
Urobilinogen, UA: 0.2 mg/dL (ref 0.0–1.0)
pH: 6 (ref 5.0–8.0)

## 2013-09-28 LAB — COMPREHENSIVE METABOLIC PANEL
ALBUMIN: 2.7 g/dL — AB (ref 3.5–5.2)
ALK PHOS: 109 U/L (ref 39–117)
ALT: 14 U/L (ref 0–35)
AST: 16 U/L (ref 0–37)
BUN: 5 mg/dL — ABNORMAL LOW (ref 6–23)
CO2: 22 mEq/L (ref 19–32)
Calcium: 9.4 mg/dL (ref 8.4–10.5)
Chloride: 103 mEq/L (ref 96–112)
Creatinine, Ser: 0.63 mg/dL (ref 0.50–1.10)
GFR calc Af Amer: >90 mL/min (ref >90)
GFR calc non Af Amer: >90 mL/min (ref >90)
Glucose, Bld: 87 mg/dL (ref 70–99)
POTASSIUM: 4 meq/L (ref 3.7–5.3)
Sodium: 141 mEq/L (ref 137–147)
Total Bilirubin: 0.4 mg/dL (ref 0.3–1.2)
Total Protein: 6.5 g/dL (ref 6.0–8.3)

## 2013-09-28 LAB — CBC
HEMATOCRIT: 37.3 % (ref 36.0–46.0)
Hemoglobin: 12.7 g/dL (ref 12.0–15.0)
MCH: 30.4 pg (ref 26.0–34.0)
MCHC: 34 g/dL (ref 30.0–36.0)
MCV: 89.2 fL (ref 78.0–100.0)
Platelets: 248 10*3/uL (ref 150–400)
RBC: 4.18 MIL/uL (ref 3.87–5.11)
RDW: 15.2 % (ref 11.5–15.5)
WBC: 13.4 10*3/uL — ABNORMAL HIGH (ref 4.0–10.5)

## 2013-09-28 LAB — AMNISURE RUPTURE OF MEMBRANE (ROM) NOT AT ARMC: Amnisure ROM: NEGATIVE

## 2013-09-28 MED ORDER — PROMETHAZINE HCL 12.5 MG PO TABS: 12.5000 mg | ORAL_TABLET | Freq: Four times a day (QID) | ORAL | Status: DC | PRN

## 2013-09-28 MED ORDER — ONDANSETRON 8 MG PO TBDP: 8.0000 mg | ORAL_TABLET | Freq: Three times a day (TID) | ORAL | Status: DC | PRN

## 2013-09-28 MED ORDER — LACTATED RINGERS IV SOLN
Freq: Once | INTRAVENOUS | Status: AC
  Administered 2013-09-28: 21:00:00 via INTRAVENOUS

## 2013-09-28 MED ORDER — LACTATED RINGERS IV SOLN
Freq: Once | INTRAVENOUS | Status: AC
  Administered 2013-09-28: 19:00:00 via INTRAVENOUS

## 2013-09-28 MED ORDER — LACTATED RINGERS IV SOLN
Freq: Once | INTRAVENOUS | Status: AC
  Administered 2013-09-28: 18:00:00 via INTRAVENOUS

## 2013-09-28 MED ORDER — PROMETHAZINE HCL 25 MG/ML IJ SOLN
12.5000 mg | Freq: Once | INTRAMUSCULAR | Status: AC
  Administered 2013-09-28: 20:00:00 via INTRAVENOUS
  Filled 2013-09-28: qty 1

## 2013-09-28 MED ORDER — ONDANSETRON HCL 4 MG/2ML IJ SOLN
4.0000 mg | Freq: Once | INTRAMUSCULAR | Status: AC
  Administered 2013-09-28: 4 mg via INTRAVENOUS
  Filled 2013-09-28: qty 2

## 2013-09-28 NOTE — [facility] Note (Signed)
Pt presents with complaints of contractions since early this morning. She states that she was evaluated in [facility] yesterday for high blood pressure.

## 2013-09-28 NOTE — Discharge Instructions (Signed)
Diet for Diarrhea, Adult  Frequent, runny stools (diarrhea) may be caused or worsened by food or drink. Diarrhea may be relieved by changing your diet. Since diarrhea can last up to 7 days, it is easy for you to lose too much fluid from the body and become dehydrated. Fluids that are lost need to be replaced. Along with a modified diet, make sure you drink enough fluids to keep your urine clear or pale yellow.  DIET INSTRUCTIONS  · Ensure adequate fluid intake (hydration): have 1 cup (8 oz) of fluid for each diarrhea episode. Avoid fluids that contain simple sugars or sports drinks, fruit juices, whole milk products, and sodas. Your urine should be clear or pale yellow if you are drinking enough fluids. Hydrate with an oral rehydration solution that you can purchase at pharmacies, retail stores, and online. You can prepare an oral rehydration solution at home by mixing the following ingredients together:  ·   tsp table salt.  · ¾ tsp baking soda.  ·  tsp salt substitute containing potassium chloride.  · 1  tablespoons sugar.  · 1 L (34 oz) of water.  · Certain foods and beverages may increase the speed at which food moves through the gastrointestinal (GI) tract. These foods and beverages should be avoided and include:  · Caffeinated and alcoholic beverages.  · High-fiber foods, such as raw fruits and vegetables, nuts, seeds, and whole grain breads and cereals.  · Foods and beverages sweetened with sugar alcohols, such as xylitol, sorbitol, and mannitol.  · Some foods may be well tolerated and may help thicken stool including:  · Starchy foods, such as rice, toast, pasta, low-sugar cereal, oatmeal, grits, baked potatoes, crackers, and bagels.    · Bananas.    · Applesauce.  · Add probiotic-rich foods to help increase healthy bacteria in the GI tract, such as yogurt and fermented milk products.  RECOMMENDED FOODS AND BEVERAGES  Starches  Choose foods with less than 2 g of fiber per serving.  · Recommended:  White,  French, and pita breads, plain rolls, buns, bagels. Plain muffins, matzo. Soda, saltine, or graham crackers. Pretzels, melba toast, zwieback. Cooked cereals made with water: cornmeal, farina, cream cereals. Dry cereals: refined corn, wheat, rice. Potatoes prepared any way without skins, refined macaroni, spaghetti, noodles, refined rice.  · Avoid:  Bread, rolls, or crackers made with whole wheat, multi-grains, rye, bran seeds, nuts, or coconut. Corn tortillas or taco shells. Cereals containing whole grains, multi-grains, bran, coconut, nuts, raisins. Cooked or dry oatmeal. Coarse wheat cereals, granola. Cereals advertised as "high-fiber." Potato skins. Whole grain pasta, wild or brown rice. Popcorn. Sweet potatoes, yams. Sweet rolls, doughnuts, waffles, pancakes, sweet breads.  Vegetables  · Recommended: Strained tomato and vegetable juices. Most well-cooked and canned vegetables without seeds. Fresh: Tender lettuce, cucumber without the skin, cabbage, spinach, bean sprouts.  · Avoid: Fresh, cooked, or canned: Artichokes, baked beans, beet greens, broccoli, Brussels sprouts, corn, kale, legumes, peas, sweet potatoes. Cooked: Green or red cabbage, spinach. Avoid large servings of any vegetables because vegetables shrink when cooked, and they contain more fiber per serving than fresh vegetables.  Fruit  · Recommended: Cooked or canned: Apricots, applesauce, cantaloupe, cherries, fruit cocktail, grapefruit, grapes, kiwi, mandarin oranges, peaches, pears, plums, watermelon. Fresh: Apples without skin, ripe banana, grapes, cantaloupe, cherries, grapefruit, peaches, oranges, plums. Keep servings limited to ½ cup or 1 piece.  · Avoid: Fresh: Apples with skin, apricots, mangoes, pears, raspberries, strawberries. Prune juice, stewed or dried prunes. Dried   fruits, raisins, dates. Large servings of all fresh fruits.  Protein  · Recommended: Ground or well-cooked tender beef, ham, veal, lamb, pork, or poultry. Eggs. Fish,  oysters, shrimp, lobster, other seafoods. Liver, organ meats.  · Avoid: Tough, fibrous meats with gristle. Peanut butter, smooth or chunky. Cheese, nuts, seeds, legumes, dried peas, beans, lentils.  Dairy  · Recommended: Yogurt, lactose-free milk, kefir, drinkable yogurt, buttermilk, soy milk, or plain hard cheese.  · Avoid: Milk, chocolate milk, beverages made with milk, such as milkshakes.  Soups  · Recommended: Bouillon, broth, or soups made from allowed foods. Any strained soup.  · Avoid: Soups made from vegetables that are not allowed, cream or milk-based soups.  Desserts and Sweets  · Recommended: Sugar-free gelatin, sugar-free frozen ice pops made without sugar alcohol.  · Avoid: Plain cakes and cookies, pie made with fruit, pudding, custard, cream pie. Gelatin, fruit, ice, sherbet, frozen ice pops. Ice cream, ice milk without nuts. Plain hard candy, honey, jelly, molasses, syrup, sugar, chocolate syrup, gumdrops, marshmallows.  Fats and Oils  · Recommended: Limit fats to less than 8 tsp per day.  · Avoid: Seeds, nuts, olives, avocados. Margarine, butter, cream, mayonnaise, salad oils, plain salad dressings. Plain gravy, crisp bacon without rind.  Beverages  · Recommended: Water, decaffeinated teas, oral rehydration solutions, sugar-free beverages not sweetened with sugar alcohols.  · Avoid: Fruit juices, caffeinated beverages (coffee, tea, soda), alcohol, sports drinks, or lemon-lime soda.  Condiments  · Recommended: Ketchup, mustard, horseradish, vinegar, cocoa powder. Spices in moderation: allspice, basil, bay leaves, celery powder or leaves, cinnamon, cumin powder, curry powder, ginger, mace, marjoram, onion or garlic powder, oregano, paprika, parsley flakes, ground pepper, rosemary, sage, savory, tarragon, thyme, turmeric.  · Avoid: Coconut, honey.  Document Released: 10/14/2003 Document Revised: 04/17/2012 Document Reviewed: 12/08/2011  [employer]® Patient Information ©2014 [employer].

## 2013-09-28 NOTE — MAU Provider Note (Signed)
History     CSN: [id number]  Arrival date and time: 09/28/13 1725   None     Chief Complaint  Patient presents with  . Labor Eval   HPI THis is a 28 y.o. female at [redacted]w[redacted]d who presents with c/o diarrhea today.  Has also had contractions and one episode of leaking. No bleeding. + headache and occasionally sees spots. + RUQ pain, is new.   RN Note: 28 yo, G2P1 at [redacted]w[redacted]d, presents to MAU with c/o watery stools every hour since since 1100 today. Was doing 24-hr urine for PIH, stopped today due to diarrhea.  Is unsure if water is broken; reports clear vaginal fluid noticed on arrival here.        OB History   Grav Para Term Preterm Abortions TAB SAB Ect Mult Living   2 1 1       1       Past Medical History  Diagnosis Date  . Bronchitis   . Thyroid disease     hypothyroidism  . Anemia   . Hypothyroidism     Past Surgical History  Procedure Laterality Date  . Cesarean section      Family History  Problem Relation Age of Onset  . Cancer Father     History  Substance Use Topics  . Smoking status: Never Smoker   . Smokeless tobacco: Never Used  . Alcohol Use: No    Allergies:  Allergies  Allergen Reactions  . Latex Rash    Prescriptions prior to admission  Medication Sig Dispense Refill  . acetaminophen (TYLENOL) 500 MG tablet Take 1,000 mg by mouth every 6 (six) hours as needed for mild pain or headache.      . cetirizine (ZYRTEC) 5 MG tablet Take 5 mg by mouth daily.      . Docosahexaenoic Acid (DHA PO) Take 1 capsule by mouth daily.      Marland Kitchen docusate sodium (COLACE) 100 MG capsule Take 100 mg by mouth daily.      . folic acid (FOLVITE) 1 MG tablet Take 1 mg by mouth daily.      . IRON PO Take 150 mg by mouth daily.      Marland Kitchen levothyroxine (SYNTHROID, LEVOTHROID) 100 MCG tablet Take 100 mcg by mouth daily.        . Prenatal Vitamins (DIS) TABS Take 1 tablet by mouth at bedtime.         Review of Systems  Constitutional: Positive for malaise/fatigue. Negative  for fever and chills.  Eyes: Positive for blurred vision (sees spots sometimes).  Cardiovascular: Positive for palpitations (heart racing). Negative for chest pain.  Gastrointestinal: Positive for abdominal pain (RUQ) and diarrhea. Negative for nausea, vomiting and constipation.  Neurological: Positive for headaches. Negative for dizziness and focal weakness.   Physical Exam   Blood pressure 135/89, pulse 137, temperature 98.2 F (36.8 C), temperature source Oral, resp. rate 22, SpO2 98.00%.  Physical Exam  Constitutional: She is oriented to person, place, and time. She appears well-developed and well-nourished. No distress.  HENT:  Head: Normocephalic.  Neck: Normal range of motion. Neck supple.  Cardiovascular: Regular rhythm and normal heart sounds.  Exam reveals no gallop and no friction rub.   No murmur (HR Rapid 130s-140s) heard. Respiratory: Effort normal and breath sounds normal. No respiratory distress. She has no wheezes. She has no rales. She exhibits no tenderness.  GI: Soft. She exhibits no distension and no mass. There is tenderness (RUQ is quite tender). There  is no rebound and no guarding.  Musculoskeletal: Normal range of motion.  Neurological: She is alert and oriented to person, place, and time. She has normal reflexes. She displays normal reflexes. She exhibits normal muscle tone (No clonus).  Skin: Skin is warm and dry.  Psychiatric: She has a normal mood and affect.   EKG:  Sinus Tach at 142 with Possible Ant infarct, age undetermined MAU Course  Procedures  Results for orders placed during the hospital encounter of 09/28/13 (from the past 24 hour(s))  CBC     Status: Abnormal   Collection Time    09/28/13  6:15 PM      Result Value Ref Range   WBC 13.4 (*) 4.0 - 10.5 K/uL   RBC 4.18  3.87 - 5.11 MIL/uL   Hemoglobin 12.7  12.0 - 15.0 g/dL   HCT 16.137.3  09.636.0 - 04.546.0 %   MCV 89.2  78.0 - 100.0 fL   MCH 30.4  26.0 - 34.0 pg   MCHC 34.0  30.0 - 36.0 g/dL   RDW  40.915.2  81.111.5 - 91.415.5 %   Platelets 248  150 - 400 K/uL  COMPREHENSIVE METABOLIC PANEL     Status: Abnormal   Collection Time    09/28/13  6:15 PM      Result Value Ref Range   Sodium 141  137 - 147 mEq/L   Potassium 4.0  3.7 - 5.3 mEq/L   Chloride 103  96 - 112 mEq/L   CO2 22  19 - 32 mEq/L   Glucose, Bld 87  70 - 99 mg/dL   BUN 5 (*) 6 - 23 mg/dL   Creatinine, Ser 7.820.63  0.50 - 1.10 mg/dL   Calcium 9.4  8.4 - 95.610.5 mg/dL   Total Protein 6.5  6.0 - 8.3 g/dL   Albumin 2.7 (*) 3.5 - 5.2 g/dL   AST 16  0 - 37 U/L   ALT 14  0 - 35 U/L   Alkaline Phosphatase 109  39 - 117 U/L   Total Bilirubin 0.4  0.3 - 1.2 mg/dL   GFR calc non Af Amer >90  >90 mL/min   GFR calc Af Amer >90  >90 mL/min  URINALYSIS, ROUTINE W REFLEX MICROSCOPIC     Status: Abnormal   Collection Time    09/28/13  6:50 PM      Result Value Ref Range   Color, Urine YELLOW  YELLOW   APPearance HAZY (*) CLEAR   Specific Gravity, Urine >1.030 (*) 1.005 - 1.030   pH 6.0  5.0 - 8.0   Glucose, UA NEGATIVE  NEGATIVE mg/dL   Hgb urine dipstick NEGATIVE  NEGATIVE   Bilirubin Urine SMALL (*) NEGATIVE   Ketones, ur >80 (*) NEGATIVE mg/dL   Protein, ur NEGATIVE  NEGATIVE mg/dL   Urobilinogen, UA 0.2  0.0 - 1.0 mg/dL   Nitrite NEGATIVE  NEGATIVE   Leukocytes, UA NEGATIVE  NEGATIVE  AMNISURE RUPTURE OF MEMBRANE (ROM)     Status: None   Collection Time    09/28/13  8:44 PM      Result Value Ref Range   Amnisure ROM NEGATIVE       MDM EKG and labs ordered, including TSH (hx hypothyroidism) Consulted Dr Tenny Crawoss  >> will try IV hydration  >>  After two liters HR is around 118 now.  Diarrhea stopped after Zofran, but pt still nauseated  >> Phenergan given. Had a few gushes of fluid. Two ferning tests  were negative, so Amnisure sent  >>  D/W Dr. Tenny Craw, ok for DC home unless patient is not feeling like she could manage her sx at home. Patient feels that she could manage her sx at home for now. Will dc home. Call the office to discuss BP  monitoring and plan for 24 hour urine.     Assessment and Plan  Report to oncoming CNM  1. Viral gastroenteritis      Medication List         acetaminophen 500 MG tablet  Commonly known as:  TYLENOL  Take 1,000 mg by mouth every 6 (six) hours as needed for mild pain or headache.     cetirizine 5 MG tablet  Commonly known as:  ZYRTEC  Take 5 mg by mouth daily.     DHA PO  Take 1 capsule by mouth daily.     docusate sodium 100 MG capsule  Commonly known as:  COLACE  Take 100 mg by mouth daily.     folic acid 1 MG tablet  Commonly known as:  FOLVITE  Take 1 mg by mouth daily.     IRON PO  Take 150 mg by mouth daily.     levothyroxine 100 MCG tablet  Commonly known as:  SYNTHROID, LEVOTHROID  Take 100 mcg by mouth daily.     ondansetron 8 MG disintegrating tablet  Commonly known as:  ZOFRAN ODT  Take 1 tablet (8 mg total) by mouth every 8 (eight) hours as needed for nausea or vomiting.     Prenatal Vitamins (DIS) Tabs  Take 1 tablet by mouth at bedtime.     promethazine 12.5 MG tablet  Commonly known as:  PHENERGAN  Take 1 tablet (12.5 mg total) by mouth every 6 (six) hours as needed for nausea or vomiting.       Follow-up Information   Call Almon Hercules., MD. (tomorrow to discuss plans for 24 hour urine. )    Specialty:  Obstetrics and Gynecology   Contact information:   [address] [place] [address] [phone]        [facility] 09/28/2013, 6:04 PM

## 2013-09-28 NOTE — MAU Note (Signed)
28 yo, G2P1 at 1750w1d, presents to MAU with c/o watery stools every hour since  since 1100 today. Was doing 24-hr urine for PIH, stopped today due to diarrhea.  Is unsure if water is broken; reports clear vaginal fluid noticed on arrival here.

## 2013-09-29 LAB — TSH: TSH: 0.294 u[IU]/mL — ABNORMAL LOW (ref 0.350–4.500)

## 2013-10-01 ENCOUNTER — Encounter (HOSPITAL_COMMUNITY): Payer: BC Managed Care – PPO | Admitting: Registered Nurse

## 2013-10-01 ENCOUNTER — Encounter (HOSPITAL_COMMUNITY)
Admission: AD | Disposition: A | Discharge status: Home / Self Care | Payer: Self-pay | Source: Ambulatory Visit | Attending: Obstetrics and Gynecology

## 2013-10-01 ENCOUNTER — Inpatient Hospital Stay (HOSPITAL_COMMUNITY)
Admission: AD | Admit: 2013-10-01 | Payer: BC Managed Care – PPO | Source: Ambulatory Visit | Admitting: Obstetrics and Gynecology

## 2013-10-01 ENCOUNTER — Encounter (HOSPITAL_COMMUNITY): Payer: Self-pay | Admitting: *Deleted

## 2013-10-01 ENCOUNTER — Inpatient Hospital Stay (HOSPITAL_COMMUNITY)
Admission: AD | Admit: 2013-10-01 | Discharge: 2013-10-03 | DRG: 765 | Disposition: A | Discharge status: Home / Self Care | Payer: BC Managed Care – PPO | Source: Ambulatory Visit | Attending: Obstetrics and Gynecology | Admitting: Obstetrics and Gynecology

## 2013-10-01 ENCOUNTER — Inpatient Hospital Stay (HOSPITAL_COMMUNITY): Payer: BC Managed Care – PPO | Admitting: Registered Nurse

## 2013-10-01 DIAGNOSIS — Z6841 Body Mass Index (BMI) 40.0 and over, adult: Secondary | ICD-10-CM

## 2013-10-01 DIAGNOSIS — E039 Hypothyroidism, unspecified: Secondary | ICD-10-CM | POA: Diagnosis present

## 2013-10-01 DIAGNOSIS — E669 Obesity, unspecified: Secondary | ICD-10-CM | POA: Diagnosis present

## 2013-10-01 DIAGNOSIS — O99284 Endocrine, nutritional and metabolic diseases complicating childbirth: Secondary | ICD-10-CM

## 2013-10-01 DIAGNOSIS — O9989 Other specified diseases and conditions complicating pregnancy, childbirth and the puerperium: Secondary | ICD-10-CM

## 2013-10-01 DIAGNOSIS — O99892 Other specified diseases and conditions complicating childbirth: Secondary | ICD-10-CM | POA: Diagnosis present

## 2013-10-01 DIAGNOSIS — O34219 Maternal care for unspecified type scar from previous cesarean delivery: Secondary | ICD-10-CM | POA: Diagnosis present

## 2013-10-01 DIAGNOSIS — O99214 Obesity complicating childbirth: Secondary | ICD-10-CM

## 2013-10-01 DIAGNOSIS — Z22330 Carrier of Group B streptococcus: Secondary | ICD-10-CM

## 2013-10-01 DIAGNOSIS — O328XX0 Maternal care for other malpresentation of fetus, not applicable or unspecified: Principal | ICD-10-CM | POA: Diagnosis present

## 2013-10-01 DIAGNOSIS — E079 Disorder of thyroid, unspecified: Secondary | ICD-10-CM | POA: Diagnosis present

## 2013-10-01 DIAGNOSIS — O4290 Premature rupture of membranes, unspecified as to length of time between rupture and onset of labor, unspecified weeks of gestation: Secondary | ICD-10-CM | POA: Diagnosis present

## 2013-10-01 LAB — TYPE AND SCREEN
ABO/RH(D): A POS
ANTIBODY SCREEN: NEGATIVE

## 2013-10-01 LAB — RPR: RPR: NONREACTIVE

## 2013-10-01 LAB — CBC
HCT: 38.1 % (ref 36.0–46.0)
Hemoglobin: 12.7 g/dL (ref 12.0–15.0)
MCH: 29.9 pg (ref 26.0–34.0)
MCHC: 33.3 g/dL (ref 30.0–36.0)
MCV: 89.6 fL (ref 78.0–100.0)
PLATELETS: 244 10*3/uL (ref 150–400)
RBC: 4.25 MIL/uL (ref 3.87–5.11)
RDW: 15.5 % (ref 11.5–15.5)
WBC: 8.1 10*3/uL (ref 4.0–10.5)

## 2013-10-01 LAB — POCT FERN TEST
POCT FERN TEST: NEGATIVE
POCT FERN TEST: POSITIVE

## 2013-10-01 LAB — ABO/RH: ABO/RH(D): A POS

## 2013-10-01 SURGERY — Surgical Case
Anesthesia: Spinal | Laterality: Bilateral

## 2013-10-01 MED ORDER — MEPERIDINE HCL 25 MG/ML IJ SOLN: 6.2500 mg | INTRAMUSCULAR | Status: DC | PRN

## 2013-10-01 MED ORDER — MORPHINE SULFATE (PF) 0.5 MG/ML IJ SOLN
INTRAMUSCULAR | Status: DC | PRN
  Administered 2013-10-01: .2 mg via INTRATHECAL

## 2013-10-01 MED ORDER — DIPHENHYDRAMINE HCL 25 MG PO CAPS: 25.0000 mg | ORAL_CAPSULE | Freq: Four times a day (QID) | ORAL | Status: DC | PRN

## 2013-10-01 MED ORDER — PHENYLEPHRINE 40 MCG/ML (10ML) SYRINGE FOR IV PUSH (FOR BLOOD PRESSURE SUPPORT)
PREFILLED_SYRINGE | INTRAVENOUS | Status: AC
  Filled 2013-10-01: qty 5

## 2013-10-01 MED ORDER — SIMETHICONE 80 MG PO CHEW: 80.0000 mg | CHEWABLE_TABLET | ORAL | Status: DC | PRN

## 2013-10-01 MED ORDER — LANOLIN HYDROUS EX OINT: 1.0000 "application " | TOPICAL_OINTMENT | CUTANEOUS | Status: DC | PRN

## 2013-10-01 MED ORDER — OXYCODONE-ACETAMINOPHEN 5-325 MG PO TABS
1.0000 | ORAL_TABLET | ORAL | Status: DC | PRN
  Administered 2013-10-01 – 2013-10-03 (×5): 1 via ORAL
  Administered 2013-10-03: 2 via ORAL
  Filled 2013-10-01: qty 2
  Filled 2013-10-01 (×5): qty 1

## 2013-10-01 MED ORDER — PHENYLEPHRINE 8 MG IN D5W 100 ML (0.08MG/ML) PREMIX OPTIME
INJECTION | INTRAVENOUS | Status: AC
  Filled 2013-10-01: qty 100

## 2013-10-01 MED ORDER — CITRIC ACID-SODIUM CITRATE 334-500 MG/5ML PO SOLN
30.0000 mL | Freq: Once | ORAL | Status: AC
  Administered 2013-10-01: 30 mL via ORAL
  Filled 2013-10-01: qty 15

## 2013-10-01 MED ORDER — METOCLOPRAMIDE HCL 5 MG/ML IJ SOLN
10.0000 mg | Freq: Three times a day (TID) | INTRAMUSCULAR | Status: DC | PRN
  Administered 2013-10-01: 10 mg via INTRAVENOUS

## 2013-10-01 MED ORDER — ONDANSETRON HCL 4 MG/2ML IJ SOLN
INTRAMUSCULAR | Status: AC
  Filled 2013-10-01: qty 2

## 2013-10-01 MED ORDER — KETOROLAC TROMETHAMINE 30 MG/ML IJ SOLN
30.0000 mg | Freq: Four times a day (QID) | INTRAMUSCULAR | Status: DC | PRN
  Administered 2013-10-01: 30 mg via INTRAVENOUS

## 2013-10-01 MED ORDER — ONDANSETRON HCL 4 MG/2ML IJ SOLN: 4.0000 mg | Freq: Once | INTRAMUSCULAR | Status: DC | PRN

## 2013-10-01 MED ORDER — OXYTOCIN 40 UNITS IN LACTATED RINGERS INFUSION - SIMPLE MED: 62.5000 mL/h | INTRAVENOUS | Status: AC

## 2013-10-01 MED ORDER — NALBUPHINE HCL 10 MG/ML IJ SOLN: 5.0000 mg | INTRAMUSCULAR | Status: DC | PRN

## 2013-10-01 MED ORDER — KETOROLAC TROMETHAMINE 30 MG/ML IJ SOLN: 30.0000 mg | Freq: Four times a day (QID) | INTRAMUSCULAR | Status: AC | PRN

## 2013-10-01 MED ORDER — FENTANYL CITRATE 0.05 MG/ML IJ SOLN
INTRAMUSCULAR | Status: AC
  Filled 2013-10-01: qty 2

## 2013-10-01 MED ORDER — DIPHENHYDRAMINE HCL 50 MG/ML IJ SOLN: 12.5000 mg | INTRAMUSCULAR | Status: DC | PRN

## 2013-10-01 MED ORDER — MORPHINE SULFATE 0.5 MG/ML IJ SOLN
INTRAMUSCULAR | Status: AC
  Filled 2013-10-01: qty 10

## 2013-10-01 MED ORDER — IBUPROFEN 600 MG PO TABS
600.0000 mg | ORAL_TABLET | Freq: Four times a day (QID) | ORAL | Status: DC
  Administered 2013-10-01 – 2013-10-03 (×6): 600 mg via ORAL
  Filled 2013-10-01 (×7): qty 1

## 2013-10-01 MED ORDER — SODIUM CHLORIDE 0.9 % IJ SOLN: 3.0000 mL | INTRAMUSCULAR | Status: DC | PRN

## 2013-10-01 MED ORDER — NALOXONE HCL 0.4 MG/ML IJ SOLN: 0.4000 mg | INTRAMUSCULAR | Status: DC | PRN

## 2013-10-01 MED ORDER — DEXTROSE 5 % IV SOLN
1.0000 ug/kg/h | INTRAVENOUS | Status: DC | PRN
  Filled 2013-10-01: qty 2

## 2013-10-01 MED ORDER — ONDANSETRON HCL 4 MG/2ML IJ SOLN: 4.0000 mg | Freq: Three times a day (TID) | INTRAMUSCULAR | Status: DC | PRN

## 2013-10-01 MED ORDER — TETANUS-DIPHTH-ACELL PERTUSSIS 5-2.5-18.5 LF-MCG/0.5 IM SUSP: 0.5000 mL | Freq: Once | INTRAMUSCULAR | Status: DC

## 2013-10-01 MED ORDER — FENTANYL CITRATE 0.05 MG/ML IJ SOLN
INTRAMUSCULAR | Status: DC | PRN
  Administered 2013-10-01: 12.5 ug via INTRATHECAL

## 2013-10-01 MED ORDER — METOCLOPRAMIDE HCL 5 MG/ML IJ SOLN: 10.0000 mg | Freq: Three times a day (TID) | INTRAMUSCULAR | Status: DC | PRN

## 2013-10-01 MED ORDER — LEVOTHYROXINE SODIUM 100 MCG PO TABS: 100.0000 ug | ORAL_TABLET | Freq: Every day | ORAL | Status: DC

## 2013-10-01 MED ORDER — EPHEDRINE 5 MG/ML INJ
INTRAVENOUS | Status: AC
  Filled 2013-10-01: qty 10

## 2013-10-01 MED ORDER — OXYTOCIN 40 UNITS IN LACTATED RINGERS INFUSION - SIMPLE MED
INTRAVENOUS | Status: DC | PRN
  Administered 2013-10-01: 40 [IU] via INTRAVENOUS

## 2013-10-01 MED ORDER — LACTATED RINGERS IV SOLN
INTRAVENOUS | Status: DC
  Administered 2013-10-01 (×4): via INTRAVENOUS

## 2013-10-01 MED ORDER — SCOPOLAMINE 1 MG/3DAYS TD PT72
MEDICATED_PATCH | TRANSDERMAL | Status: AC
  Filled 2013-10-01: qty 1

## 2013-10-01 MED ORDER — WITCH HAZEL-GLYCERIN EX PADS: 1.0000 "application " | MEDICATED_PAD | CUTANEOUS | Status: DC | PRN

## 2013-10-01 MED ORDER — SENNOSIDES-DOCUSATE SODIUM 8.6-50 MG PO TABS
2.0000 | ORAL_TABLET | ORAL | Status: DC
  Administered 2013-10-01 – 2013-10-02 (×2): 2 via ORAL
  Filled 2013-10-01 (×2): qty 2

## 2013-10-01 MED ORDER — DEXTROSE 5 % IV SOLN
3.0000 g | INTRAVENOUS | Status: AC
  Administered 2013-10-01 (×2): 3 g via INTRAVENOUS
  Filled 2013-10-01: qty 3000

## 2013-10-01 MED ORDER — NALOXONE HCL 1 MG/ML IJ SOLN
1.0000 ug/kg/h | INTRAVENOUS | Status: DC | PRN
  Filled 2013-10-01: qty 2

## 2013-10-01 MED ORDER — SIMETHICONE 80 MG PO CHEW
80.0000 mg | CHEWABLE_TABLET | ORAL | Status: DC
  Administered 2013-10-01 – 2013-10-02 (×2): 80 mg via ORAL
  Filled 2013-10-01 (×2): qty 1

## 2013-10-01 MED ORDER — ONDANSETRON HCL 4 MG PO TABS: 4.0000 mg | ORAL_TABLET | ORAL | Status: DC | PRN

## 2013-10-01 MED ORDER — DIPHENHYDRAMINE HCL 25 MG PO CAPS: 25.0000 mg | ORAL_CAPSULE | ORAL | Status: DC | PRN

## 2013-10-01 MED ORDER — ZOLPIDEM TARTRATE 5 MG PO TABS: 5.0000 mg | ORAL_TABLET | Freq: Every evening | ORAL | Status: DC | PRN

## 2013-10-01 MED ORDER — SCOPOLAMINE 1 MG/3DAYS TD PT72: 1.0000 | MEDICATED_PATCH | Freq: Once | TRANSDERMAL | Status: DC

## 2013-10-01 MED ORDER — PRENATAL MULTIVITAMIN CH
1.0000 | ORAL_TABLET | Freq: Every day | ORAL | Status: DC
  Administered 2013-10-02: 1 via ORAL
  Filled 2013-10-01: qty 1

## 2013-10-01 MED ORDER — KETOROLAC TROMETHAMINE 30 MG/ML IJ SOLN: 30.0000 mg | Freq: Four times a day (QID) | INTRAMUSCULAR | Status: DC | PRN

## 2013-10-01 MED ORDER — SIMETHICONE 80 MG PO CHEW
80.0000 mg | CHEWABLE_TABLET | Freq: Three times a day (TID) | ORAL | Status: DC
  Administered 2013-10-02 – 2013-10-03 (×4): 80 mg via ORAL
  Filled 2013-10-01 (×4): qty 1

## 2013-10-01 MED ORDER — NALBUPHINE HCL 10 MG/ML IJ SOLN
5.0000 mg | INTRAMUSCULAR | Status: DC | PRN
  Administered 2013-10-01 – 2013-10-02 (×2): 10 mg via INTRAVENOUS
  Filled 2013-10-01 (×2): qty 1

## 2013-10-01 MED ORDER — LACTATED RINGERS IV SOLN
INTRAVENOUS | Status: DC
  Administered 2013-10-01: 22:00:00 via INTRAVENOUS

## 2013-10-01 MED ORDER — ONDANSETRON HCL 4 MG/2ML IJ SOLN: 4.0000 mg | INTRAMUSCULAR | Status: DC | PRN

## 2013-10-01 MED ORDER — ONDANSETRON HCL 4 MG/2ML IJ SOLN
4.0000 mg | Freq: Once | INTRAMUSCULAR | Status: AC
  Administered 2013-10-01: 4 mg via INTRAVENOUS
  Filled 2013-10-01: qty 2

## 2013-10-01 MED ORDER — METOCLOPRAMIDE HCL 5 MG/ML IJ SOLN
INTRAMUSCULAR | Status: AC
  Filled 2013-10-01: qty 2

## 2013-10-01 MED ORDER — DIPHENHYDRAMINE HCL 50 MG/ML IJ SOLN: 25.0000 mg | INTRAMUSCULAR | Status: DC | PRN

## 2013-10-01 MED ORDER — BUPIVACAINE IN DEXTROSE 0.75-8.25 % IT SOLN
INTRATHECAL | Status: DC | PRN
  Administered 2013-10-01: 1.8 mL via INTRATHECAL

## 2013-10-01 MED ORDER — MENTHOL 3 MG MT LOZG: 1.0000 | LOZENGE | OROMUCOSAL | Status: DC | PRN

## 2013-10-01 MED ORDER — FAMOTIDINE IN NACL 20-0.9 MG/50ML-% IV SOLN
20.0000 mg | Freq: Once | INTRAVENOUS | Status: AC
  Administered 2013-10-01: 20 mg via INTRAVENOUS
  Filled 2013-10-01: qty 50

## 2013-10-01 MED ORDER — ONDANSETRON HCL 4 MG/2ML IJ SOLN
INTRAMUSCULAR | Status: DC | PRN
  Administered 2013-10-01: 4 mg via INTRAVENOUS

## 2013-10-01 MED ORDER — OXYTOCIN 10 UNIT/ML IJ SOLN
INTRAMUSCULAR | Status: AC
  Filled 2013-10-01: qty 4

## 2013-10-01 MED ORDER — SCOPOLAMINE 1 MG/3DAYS TD PT72
1.0000 | MEDICATED_PATCH | Freq: Once | TRANSDERMAL | Status: DC
  Administered 2013-10-01: 1.5 mg via TRANSDERMAL

## 2013-10-01 MED ORDER — DIBUCAINE 1 % RE OINT: 1.0000 "application " | TOPICAL_OINTMENT | RECTAL | Status: DC | PRN

## 2013-10-01 MED ORDER — DEXTROSE 5 % IV SOLN
20.0000 mg | INTRAVENOUS | Status: DC | PRN
  Administered 2013-10-01: 45 ug/min via INTRAVENOUS

## 2013-10-01 MED ORDER — CEFAZOLIN SODIUM-DEXTROSE 2-3 GM-% IV SOLR
2.0000 g | Freq: Once | INTRAVENOUS | Status: AC
  Administered 2013-10-01: 2 g via INTRAVENOUS
  Filled 2013-10-01: qty 50

## 2013-10-01 MED ORDER — DIPHENHYDRAMINE HCL 25 MG PO CAPS
25.0000 mg | ORAL_CAPSULE | ORAL | Status: DC | PRN
  Filled 2013-10-01: qty 1

## 2013-10-01 MED ORDER — KETOROLAC TROMETHAMINE 30 MG/ML IJ SOLN
INTRAMUSCULAR | Status: AC
  Filled 2013-10-01: qty 1

## 2013-10-01 MED ORDER — FENTANYL CITRATE 0.05 MG/ML IJ SOLN
25.0000 ug | INTRAMUSCULAR | Status: DC | PRN
  Administered 2013-10-01: 25 ug via INTRAVENOUS

## 2013-10-01 SURGICAL SUPPLY — 31 items
ADH SKN CLS APL DERMABOND .7 (GAUZE/BANDAGES/DRESSINGS)
CLAMP CORD UMBIL (MISCELLANEOUS) IMPLANT
CLOTH BEACON ORANGE TIMEOUT ST (SAFETY) ×2 IMPLANT
DERMABOND ADVANCED (GAUZE/BANDAGES/DRESSINGS)
DERMABOND ADVANCED .7 DNX12 (GAUZE/BANDAGES/DRESSINGS) IMPLANT
DRAPE LG THREE QUARTER DISP (DRAPES) IMPLANT
DRSG OPSITE POSTOP 4X10 (GAUZE/BANDAGES/DRESSINGS) ×2 IMPLANT
DURAPREP 26ML APPLICATOR (WOUND CARE) ×2 IMPLANT
ELECT REM PT RETURN 9FT ADLT (ELECTROSURGICAL) ×2
ELECTRODE REM PT RTRN 9FT ADLT (ELECTROSURGICAL) ×1 IMPLANT
EXTRACTOR VACUUM M CUP 4 TUBE (SUCTIONS) IMPLANT
GLOVE BIO SURGEON STRL SZ7 (GLOVE) ×2 IMPLANT
GLOVE INDICATOR 7.5 STRL GRN (GLOVE) ×6 IMPLANT
GOWN STRL REIN XL XLG (GOWN DISPOSABLE) ×4 IMPLANT
KIT ABG SYR 3ML LUER SLIP (SYRINGE) ×2 IMPLANT
NEEDLE HYPO 25X5/8 SAFETYGLIDE (NEEDLE) IMPLANT
NS IRRIG 1000ML POUR BTL (IV SOLUTION) ×2 IMPLANT
PACK C SECTION WH (CUSTOM PROCEDURE TRAY) ×2 IMPLANT
PAD OB MATERNITY 4.3X12.25 (PERSONAL CARE ITEMS) ×2 IMPLANT
RTRCTR C-SECT PINK 25CM LRG (MISCELLANEOUS) ×2 IMPLANT
STAPLER VISISTAT 35W (STAPLE) IMPLANT
SUT CHROMIC 1 CTX 36 (SUTURE) ×4 IMPLANT
SUT PDS AB 0 CTX 60 (SUTURE) ×2 IMPLANT
SUT PLAIN 2 0 XLH (SUTURE) ×2 IMPLANT
SUT VIC AB 2-0 CT1 27 (SUTURE) ×2
SUT VIC AB 2-0 CT1 TAPERPNT 27 (SUTURE) ×1 IMPLANT
SUT VIC AB 4-0 KS 27 (SUTURE) ×2 IMPLANT
TOWEL OR 17X24 6PK STRL BLUE (TOWEL DISPOSABLE) ×2 IMPLANT
TRAY FOLEY BAG SILVER LF 14FR (CATHETERS) ×2 IMPLANT
TRAY FOLEY CATH 14FR (SET/KITS/TRAYS/PACK) ×2 IMPLANT
WATER STERILE IRR 1000ML POUR (IV SOLUTION) ×2 IMPLANT

## 2013-10-01 NOTE — Anesthesia Postprocedure Evaluation (Signed)
  Anesthesia Post-op Note  Patient: Cherylynn RidgesKristen H Selders  Procedure(s) Performed: Procedure(s): Repeat CESAREAN SECTION (Bilateral)  Patient Location: PACU  Anesthesia Type:Spinal  Level of Consciousness: awake, alert  and oriented  Airway and Oxygen Therapy: Patient Spontanous Breathing  Post-op Pain: mild  Post-op Assessment: Post-op Vital signs reviewed, Patient's Cardiovascular Status Stable, Respiratory Function Stable, Patent Airway, No signs of Nausea or vomiting and Pain level controlled  Post-op Vital Signs: Reviewed and stable  Complications: No apparent anesthesia complications

## 2013-10-01 NOTE — Lactation Note (Signed)
This note was copied from the chart of Boy Flint MelterKristen Vanecek. Lactation Consultation Note  Patient Name: Boy Flint MelterKristen Portee JWJXB'JToday's Date: 10/01/2013 Reason for consult: Initial assessment;Other (Comment) (LGA baby with low initial BS=34).  LC arrived as RN, Harriett Sineancy was completing mom's admission assessment.  Family members present but once they left, LC was able to move baby from STS between mom's breasts into a cross-over-chest position on mom's right breast and with additional pillow support, hand expressed colostrum and just a few brief attempts, he latches with widely flanged lips and strong/rhythmical sucking bursts.  He sustains latch and sucking bursts with itermittent swallows noted for 10 minutes, then he slips off on his own and mom is able to return him to STS.  Report of feeding assessment given to CN RN, Beth and follow-up BS scheduled for 1745, since he had received some hand expressed colostrum in PACU.  LC encouraged STS, cue feedings and signs of proper latch and milk transfer.  Mom breastfed her 89 yo and pumped for 8 weeks but needed to supplement and never had sufficient milk for exclusive breastfeeding. LC encouraged review of Baby and Me pp 9, 14 and 20-25 for STS and BF information. LC provided [employer] brochure and reviewed [facility] services and list of community and web site resources.    Maternal Data Formula Feeding for Exclusion: No Infant to breast within first hour of birth: No Breastfeeding delayed due to:: Other (comment) (reason not documented) Has patient been taught Hand Expression?: Yes (LC reviewed and RN, Waynetta SandyBeth had attempted and expressed some colostrum in PACU) Does the patient have breastfeeding experience prior to this delivery?: Yes  Feeding Feeding Type: Breast Fed Length of feed: 10 min  LATCH Score/Interventions Latch: Grasps breast easily, tongue down, lips flanged, rhythmical sucking. Intervention(s): Skin to skin;Teach feeding cues;Waking  techniques Intervention(s): Assist with latch;Breast compression  Audible Swallowing: Spontaneous and intermittent Intervention(s): Skin to skin;Hand expression  Type of Nipple: Everted at rest and after stimulation  Comfort (Breast/Nipple): Soft / non-tender     Hold (Positioning): Assistance needed to correctly position infant at breast and maintain latch. Intervention(s): Breastfeeding basics reviewed;Support Pillows;Position options;Skin to skin  LATCH Score: 9 (LC assisted and observed feeding, which was sustained for 10 minutes)  Lactation Tools Discussed/Used   STS, cue feedings, hand expression  Consult Status Consult Status: Follow-up Date: 10/02/13 Follow-up type: In-patient    Warrick ParisianBryant, Domnick Chervenak [facility] 10/01/2013, 5:32 PM

## 2013-10-01 NOTE — Transfer of Care (Signed)
Immediate Anesthesia Transfer of Care Note  Patient: Dawn Navarro  Procedure(s) Performed: Procedure(s): Repeat CESAREAN SECTION (Bilateral)  Patient Location: PACU  Anesthesia Type:Spinal  Level of Consciousness: awake, alert  and oriented  Airway & Oxygen Therapy: Patient Spontanous Breathing  Post-op Assessment: Report given to PACU RN and Post -op Vital signs reviewed and stable  Post vital signs: Reviewed and stable  Complications: No apparent anesthesia complications

## 2013-10-01 NOTE — MAU Provider Note (Signed)
Ms. Dawn Navarro is a 28 y.o. G2P1001 at 6252w4d who presents to MAU today with complaint of ROM around 0430 today. The patient states that she has gone through 2 pairs of underwear since then. She feels more LOF with certain movements and states that she felt a "pop." She is having occasional contractions. She denies bleeding. She reports good fetal movement.   BP 127/79  Pulse 103  Temp(Src) 98.1 F (36.7 C) (Oral)  Resp 18  Ht 5\' 7"  (1.702 m)  Wt 311 lb (141.069 kg)  BMI 48.70 kg/m2 GENERAL: Well-developed, obese female in no acute distress.  HEENT: Normocephalic, atraumatic. LUNGS: Normal effort HEART: Regular rate  ABDOMEN: Soft, gravid.  PELVIC: Normal external female genitalia. Vagina is pink and rugated.  Mucus discharge noted. Scant watery discharge noted with valsalva.   EXTREMITIES: No cyanosis, clubbing, or edema Dilation: 1 Exam by:: J. Ethier PA  + Ferning  Fetal Monitoring: Baseline: 120 bpm, moderate variability, + accelerations, no decelerations Contractions: irregular with UI  A: ROM  P: RN given report of ROM for MD. Patient to have C/S.    Freddi StarrJulie N Ethier, PA-C 10/01/2013 6:40 AM

## 2013-10-01 NOTE — Anesthesia Preprocedure Evaluation (Signed)
Anesthesia Evaluation  Patient identified by MRN, date of birth, ID band Patient awake    Reviewed: Allergy & Precautions, H&P , NPO status , Patient's Chart, lab work & pertinent test results  History of Anesthesia Complications Negative for: history of anesthetic complications  Airway Mallampati: II TM Distance: >3 FB Neck ROM: Full    Dental  (+) Teeth Intact   Pulmonary neg pulmonary ROS,    Pulmonary exam normal       Cardiovascular negative cardio ROS  Rhythm:Regular Rate:Normal     Neuro/Psych negative neurological ROS  negative psych ROS   GI/Hepatic negative GI ROS, Neg liver ROS,   Endo/Other  Hypothyroidism   Renal/GU negative Renal ROS     Musculoskeletal negative musculoskeletal ROS (+)   Abdominal   Peds  Hematology negative hematology ROS (+)   Anesthesia Other Findings   Reproductive/Obstetrics (+) Pregnancy                           Anesthesia Physical Anesthesia Plan  ASA: II  Anesthesia Plan: Spinal   Post-op Pain Management:    Induction:   Airway Management Planned: Natural Airway  Additional Equipment: None  Intra-op Plan:   Post-operative Plan:   Informed Consent: I have reviewed the patients History and Physical, chart, labs and discussed the procedure including the risks, benefits and alternatives for the proposed anesthesia with the patient or authorized representative who has indicated his/her understanding and acceptance.   Dental advisory given  Plan Discussed with: CRNA and Surgeon  Anesthesia Plan Comments:         Anesthesia Quick Evaluation

## 2013-10-01 NOTE — MAU Note (Signed)
Dr. Claiborne Billingsallahan notified of pt.

## 2013-10-01 NOTE — Op Note (Signed)
Pre-Operative Diagnosis: 1) 37+4 week intrauterine pregnancy 2) Spontaneously ruptured membranes 3) History of prior cesarean section, desires repeat cesarean 4) maternal obesity Postoperative Diagnosis: Same and double footling breech presentation Procedure: Repeat Low transverse cesarean section via Pfannenstiel skin Surgeon: Dr. Waynard ReedsKendra Brianni Manthe Assistant: Willodean Rosenthalarolyn Harraway-Smith, MD Operative Findings: Vigorous female infant in the double footling breech presentation with Apgar scores of 8 at 1 minute and 9 at 5 minutes. Bilateral ovaries and tubes were not well visualized due to the patient's habitus Specimen:placenta to pathology EBL: Total I/O In: 2500 [I.V.:2500] Out: 550 [Urine:50; Blood:500]   Procedure:Ms. Christell ConstantMoore is an [age] year old gravida 2 para 1001 at 5137 weeks and 4 days estimated gestational age who presents for cesarean section. The patient presented to maternity admission complaining of leaking fluid and was confirmed to have spontaneous rupture of membranes. Following the appropriate informed consent the patient was brought to the operating room where spinal anesthesia was administered and found to be adequate. She was placed in the dorsal supine position with a leftward tilt. She was prepped and draped in the normal sterile fashion. Scalpel was then used to make a Pfannenstiel skin incision which was carried down to the underlying layers of soft tissue to the fascia. The fascia was incised in the midline and the fascial incision was extended laterally with Mayo scissors. The superior aspect of the fascial incision was grasped with Coker clamps x2, tented up and the rectus muscles dissected off sharply with the electrocautery unit area and the same procedure was repeated on the inferior aspect of the fascial incision. The rectus muscles were separated in the midline. The abdominal peritoneum was identified, tented up, entered sharply, and the incision was extended superiorly and inferiorly with  good visualization of the bladder. The Alexis retractor was then deployed. The vesicouterine peritoneum was identified, tented up, entered sharply, and the bladder flap was created digitally. The scalpel was then used to make a low transverse incision on the uterus which was extended laterally with blunt dissection. The fetal feet were identified, delivered easily through the uterine incision followed by the body. The infant was bulb suctioned on the operative field cried vigorously, cord was clamped and cut and the infant was passed to the waiting neonatologist. Placenta was then delivered spontaneously, the uterus was cleared of all clot and debris. The uterine incision was repaired with #1 chromic in running locked fashion followed by a second imbricating layer. Ovaries and tubes were inspected and normal. The Alexis retractor was removed. The uterus was returned to the abdominal cavity the abdominal cavity was cleared of all clot and debris. The abdominal peritoneum was reapproximated with 2-0 Vicryl in a running fashion, the rectus muscles was reapproximated with #1 chromic in a running fashion. The fascia was closed with a looped PDS in a running fashion. The skin was closed with 4-0 vicryl in a subcuticular fashion and Dermabond. All sponge lap and needle counts were correct x2. Patient tolerated the procedure well and recovered in stable condition following the procedure.

## 2013-10-01 NOTE — Progress Notes (Signed)
Dr. Claiborne Billingsallahan called to say she has texted Dr. Tenny Crawoss about this pt, also OR has the pt scheduled for 1200.

## 2013-10-01 NOTE — MAU Note (Signed)
PT  SAYS SHE THINKS SROM AT 0430.  NO UC'S   IS A REPEAT C/S ON 3-9.  HAD VE IN MAU 1 CM

## 2013-10-01 NOTE — Addendum Note (Signed)
Addendum created 10/01/13 1759 by Corky Soxhris Ekaterini Capitano, MD   Modules edited: Orders, PRL Based Order Sets

## 2013-10-01 NOTE — Anesthesia Procedure Notes (Signed)
Spinal  Patient location during procedure: OR Start time: 10/01/2013 1:50 PM End time: 10/01/2013 1:54 PM Staffing Anesthesiologist: MOSER, CHRIS Performed by: anesthesiologist  Preanesthetic Checklist Completed: patient identified, surgical consent, pre-op evaluation, timeout performed, IV checked, risks and benefits discussed and monitors and equipment checked Spinal Block Patient position: sitting Prep: site prepped and draped and DuraPrep Patient monitoring: heart rate, cardiac monitor, continuous pulse ox and blood pressure Approach: midline Location: L4-5 Injection technique: single-shot Needle Needle type: Sprotte and Whitacre  Needle gauge: 25 G Needle length: 12.7 cm Needle insertion depth: 12.7 cm Assessment Sensory level: T4

## 2013-10-01 NOTE — H&P (Signed)
Dawn RidgesKristen H Navarro is a 28 y.o. female presenting for leaking fluid  28 yo G2P1001 @37 +4 presents for complaint of leaking fluid and was confirmed to have spontaneously ruptured membranes.  She was in MAU over the weekend with diarrhea and did have some slightly elevated blood pressures.  All her labwork was normal History OB History   Grav Para Term Preterm Abortions TAB SAB Ect Mult Living   2 1 1       1      Past Medical History  Diagnosis Date  . Bronchitis   . Thyroid disease     hypothyroidism  . Anemia   . Hypothyroidism    Past Surgical History  Procedure Laterality Date  . Cesarean section     Family History: family history includes Cancer in her father. Social History:  reports that she has never smoked. She has never used smokeless tobacco. She reports that she does not drink alcohol or use illicit drugs.   Prenatal Transfer Tool  Maternal Diabetes: No Genetic Screening: Normal Maternal Ultrasounds/Referrals: Normal Fetal Ultrasounds or other Referrals:  None Maternal Substance Abuse:  No Significant Maternal Medications:  None Significant Maternal Lab Results:  GBS positive Other Comments:  None  ROS: as above  Dilation: 1 Exam by:: J. Ethier PA Blood pressure 123/87, pulse 89, temperature 98.1 F (36.7 C), temperature source Oral, resp. rate 20, height 5\' 8"  (1.727 m), weight 141.069 kg (311 lb), SpO2 98.00%. Exam Physical Exam  Prenatal labs: ABO, Rh: --/--/A POS (02/25 0715) Antibody: PENDING (02/25 0715) Rubella: Immune (07/30 0000) RPR: Nonreactive (07/30 0000)  HBsAg: Negative (07/30 0000)  HIV: Non-reactive (07/30 0000)  GBS:   positive  Assessment/Plan: 1) Admit 2) Consent for repeat cesarean section. R/B/A reviewed and patient wishes to proceed 3) Ancef 2 gm in MAU and then again just prior to OR   Brylie Sneath H. 10/01/2013, 9:47 AM

## 2013-10-01 NOTE — MAU Note (Signed)
House and nursery notified of pending c/s.

## 2013-10-02 ENCOUNTER — Encounter (HOSPITAL_COMMUNITY): Payer: Self-pay | Admitting: Obstetrics and Gynecology

## 2013-10-02 LAB — CBC
HCT: 30.2 % — ABNORMAL LOW (ref 36.0–46.0)
Hemoglobin: 10.1 g/dL — ABNORMAL LOW (ref 12.0–15.0)
MCH: 29.9 pg (ref 26.0–34.0)
MCHC: 33.4 g/dL (ref 30.0–36.0)
MCV: 89.3 fL (ref 78.0–100.0)
PLATELETS: 199 10*3/uL (ref 150–400)
RBC: 3.38 MIL/uL — AB (ref 3.87–5.11)
RDW: 15.3 % (ref 11.5–15.5)
WBC: 9.1 10*3/uL (ref 4.0–10.5)

## 2013-10-02 MED ORDER — LEVOTHYROXINE SODIUM 100 MCG PO TABS
100.0000 ug | ORAL_TABLET | Freq: Every day | ORAL | Status: DC
  Administered 2013-10-02 – 2013-10-03 (×2): 100 ug via ORAL
  Filled 2013-10-02 (×2): qty 1

## 2013-10-02 NOTE — Progress Notes (Addendum)
  Patient is eating, ambulating, voiding.  Pain control is good.  Filed Vitals:   10/01/13 2225 10/02/13 0015 10/02/13 0215 10/02/13 0614  BP: 127/84 109/74 113/74 118/81  Pulse: 87 82 84 83  Temp:  98.6 F (37 C) 98.5 F (36.9 C) 97.9 F (36.6 C)  TempSrc:  Oral Oral Oral  Resp:  16 16 16   Height:      Weight:      SpO2:        lungs:   clear to auscultation cor:    RRR Abdomen:  soft, appropriate tenderness, incisions intact and without erythema or exudate ex:    no cords   Lab Results  Component Value Date   WBC 9.1 10/02/2013   HGB 10.1* 10/02/2013   HCT 30.2* 10/02/2013   MCV 89.3 10/02/2013   PLT 199 10/02/2013    --/--/A POS, A POS (02/25 0715)/RI  A/P    Post operative day 1.  Routine post op and postpartum care.  Expect d/c tomorrow.  Percocet for pain control.  Parents DO NOT desire circumsision.

## 2013-10-02 NOTE — Lactation Note (Signed)
This note was copied from the chart of Dawn Flint MelterKristen Navarro. Lactation Consultation Note  Patient Name: Dawn Flint MelterKristen Navarro JXBJY'NToday's Date: 10/02/2013 Reason for consult: Follow-up assessment Lactation consult due to infant's gestational age of [redacted] weeks, LGA, mother has a history of low supply and early weaning at 8 weeks. Baby showing cues and baby to breast. He has a recessed chin and assisted mother with using an asymmetrical latch in order to maintain the latch. Mother has widely spaced breast and soft tubular shaped breast. Mother has a hypothroid and taking synthroid. Pumping initiated for additional stimulation of breast to promote milk production. Mother was instructed to pump using the preemie setting following feedings approx in Baby fed well for 5 minutes before falling asleep and mother removing baby from the breast. Colostrum noted when hand expression was taught. Mother was advised to feed baby any milk expressed by spoon or cup. To call RN to assist with supplemental feeding. LC to follow prn.  Maternal Data    Feeding Feeding Type: Breast Fed Length of feed: 5 min  LATCH Score/Interventions Latch: Grasps breast easily, tongue down, lips flanged, rhythmical sucking. (with LC assistance) Intervention(s): Skin to skin;Teach feeding cues Intervention(s): Assist with latch  Audible Swallowing: A few with stimulation Intervention(s): Hand expression  Type of Nipple: Everted at rest and after stimulation  Comfort (Breast/Nipple): Soft / non-tender     Hold (Positioning): Assistance needed to correctly position infant at breast and maintain latch. (Baby has a recessed chin) Intervention(s): Breastfeeding basics reviewed  LATCH Score: 8  Lactation Tools Discussed/Used WIC Program: No Pump Review: Setup, frequency, and cleaning Initiated by:: BDaly, RN Date initiated:: 10/03/13   Consult Status Consult Status: Follow-up Date: 10/03/13 Follow-up type: In-patient    Christella HartiganDaly,  Jaideep Pollack M 10/02/2013, 11:08 AM

## 2013-10-03 MED ORDER — OXYCODONE-ACETAMINOPHEN 5-325 MG PO TABS: 1.0000 | ORAL_TABLET | ORAL | Status: DC | PRN

## 2013-10-03 NOTE — Progress Notes (Signed)
POD#2 Pt without complaints. Lochia -wnl IMP/ Stable Plan/ Will discharge.

## 2013-10-03 NOTE — Discharge Summary (Signed)
Obstetric Discharge Summary Reason for Admission: spontaneous abortion Prenatal Procedures: none Intrapartum Procedures: cesarean: low cervical, transverse Postpartum Procedures: none Complications-Operative and Postpartum: none Hemoglobin  Date Value Ref Range Status  10/02/2013 10.1* 12.0 - 15.0 g/dL Final     REPEATED TO VERIFY     DELTA CHECK NOTED     HCT  Date Value Ref Range Status  10/02/2013 30.2* 36.0 - 46.0 % Final    Physical Exam:  General: alert Lochia: appropriate Uterine Fundus: firm Incision: healing well DVT Evaluation: No evidence of DVT seen on physical exam.  Discharge Diagnoses: Term Pregnancy-delivered and Breech, and h.o. C/S,declines VBAC  Discharge Information: Date: 10/03/2013 Activity: pelvic rest Diet: routine Medications: PNV, Ibuprofen and Percocet Condition: stable Instructions: refer to practice specific booklet Discharge to: home Follow-up Information   Follow up with Almon HerculesOSS,KENDRA H., MD. Schedule an appointment as soon as possible for a visit in 4 weeks.   Specialty:  Obstetrics and Gynecology   Contact information:   [address] [place] [id number] [phone]       Newborn Data: Live born female  Birth Weight: 9 lb 3.6 oz (4185 g) APGAR: 8, 9  Home with mother.  Reshad Saab E 10/03/2013, 8:45 AM

## 2013-10-10 ENCOUNTER — Inpatient Hospital Stay (HOSPITAL_COMMUNITY): Admission: RE | Admit: 2013-10-10 | Payer: BC Managed Care – PPO | Source: Ambulatory Visit

## 2013-10-31 ENCOUNTER — Other Ambulatory Visit: Payer: Self-pay | Admitting: Obstetrics and Gynecology

## 2013-12-04 ENCOUNTER — Other Ambulatory Visit: Payer: BC Managed Care – PPO | Admitting: Internal Medicine

## 2013-12-04 DIAGNOSIS — E785 Hyperlipidemia, unspecified: Secondary | ICD-10-CM

## 2013-12-04 LAB — LIPID PANEL
CHOLESTEROL: 185 mg/dL (ref 0–200)
HDL: 32 mg/dL — AB (ref >39)
LDL Cholesterol: 122 mg/dL — ABNORMAL HIGH (ref 0–99)
TRIGLYCERIDES: 156 mg/dL — AB (ref <150)
Total CHOL/HDL Ratio: 5.8 Ratio
VLDL: 31 mg/dL (ref 0–40)

## 2013-12-08 ENCOUNTER — Ambulatory Visit (INDEPENDENT_AMBULATORY_CARE_PROVIDER_SITE_OTHER): Payer: BC Managed Care – PPO | Admitting: Internal Medicine

## 2013-12-08 ENCOUNTER — Encounter: Payer: Self-pay | Admitting: Internal Medicine

## 2013-12-08 VITALS — BP 114/72 | HR 76 | Wt 266.0 lb

## 2013-12-08 DIAGNOSIS — E669 Obesity, unspecified: Secondary | ICD-10-CM

## 2013-12-08 DIAGNOSIS — E7800 Pure hypercholesterolemia, unspecified: Secondary | ICD-10-CM

## 2013-12-08 DIAGNOSIS — E039 Hypothyroidism, unspecified: Secondary | ICD-10-CM

## 2013-12-08 NOTE — Progress Notes (Signed)
   Subjective:    Patient ID: Dawn Navarro, female    DOB: 12/09/1985, 28 y.o.   MRN: [id number]  HPI She delivered a baby February 25. During pregnancy she was on statin medication. Now in today to have lipid panel rechecked. Actually lipid panel looks pretty good with the exception of an elevated LDL. Previously had history of elevated triglycerides and elevated LDL. We are going to add TSH to lab work done recently. She is on levothyroxin 0.1 mg daily. She is not breast-feeding. She is on Zoloft.    Review of Systems     Objective:   Physical Exam Skin warm and dry. Nodes none. No thyromegaly. Chest clear to auscultation. Cardiac exam regular rate and rhythm. Extremities without edema        Assessment & Plan:  History of hypertriglyceridemia  History of elevated LDL cholesterol  Obesity  Hypothyroidism  Plan: TSH anti-today. Stay off of statin medication for now and work on diet and exercise. Recheck in 6 months with office visit, TSH and fasting lipid panel.

## 2013-12-08 NOTE — Patient Instructions (Signed)
TSH is pending. Stay off  statin medication and recheck in 6 months. Work on diet and exercise.

## 2013-12-12 ENCOUNTER — Ambulatory Visit (INDEPENDENT_AMBULATORY_CARE_PROVIDER_SITE_OTHER): Payer: BC Managed Care – PPO | Admitting: Internal Medicine

## 2013-12-12 ENCOUNTER — Encounter: Payer: Self-pay | Admitting: Internal Medicine

## 2013-12-12 VITALS — BP 112/70 | Temp 99.0°F | Wt 266.0 lb

## 2013-12-12 DIAGNOSIS — H6692 Otitis media, unspecified, left ear: Secondary | ICD-10-CM

## 2013-12-12 DIAGNOSIS — H6690 Otitis media, unspecified, unspecified ear: Secondary | ICD-10-CM

## 2013-12-12 MED ORDER — HYDROCODONE-ACETAMINOPHEN 5-325 MG PO TABS: 1.0000 | ORAL_TABLET | Freq: Four times a day (QID) | ORAL | Status: DC | PRN

## 2013-12-12 MED ORDER — AMOXICILLIN-POT CLAVULANATE 500-125 MG PO TABS: 1.0000 | ORAL_TABLET | Freq: Three times a day (TID) | ORAL | Status: DC

## 2013-12-12 NOTE — Progress Notes (Signed)
   Subjective:    Patient ID: Dawn Navarro, female    DOB: 02/13/1986, 28 y.o.   MRN: [id number]  HPI Awakened with acute left ear pain this morning. Says it was pretty bad. Has had issues with allergies for several weeks. [place] [facility] [place] called in Augmentin for her there for an acute otitis media    Review of Systems     Objective:   Physical Exam Left TM is injected centrally and dull throughout. Right TM is slightly full but not red. Pharynx is clear. Neck supple. Chest clear.       Assessment & Plan:  Left Otitis Media   Augmentin 500 mg tid x 10 days  Norco 5/325 1 by mouth every 6 hours when necessary pain.

## 2013-12-12 NOTE — Patient Instructions (Signed)
Take Norco 5/325 every 6 hours with food as needed for pain. Augmentin 500 mg 3 times daily for 10 days for ear infection.

## 2013-12-25 ENCOUNTER — Encounter: Payer: Self-pay | Admitting: Internal Medicine

## 2013-12-25 ENCOUNTER — Telehealth: Payer: Self-pay | Admitting: Internal Medicine

## 2013-12-25 ENCOUNTER — Ambulatory Visit (INDEPENDENT_AMBULATORY_CARE_PROVIDER_SITE_OTHER): Payer: BC Managed Care – PPO | Admitting: Internal Medicine

## 2013-12-25 VITALS — BP 120/82 | HR 76 | Temp 99.0°F

## 2013-12-25 DIAGNOSIS — H6690 Otitis media, unspecified, unspecified ear: Secondary | ICD-10-CM

## 2013-12-25 MED ORDER — HYDROCODONE-ACETAMINOPHEN 5-325 MG PO TABS: 1.0000 | ORAL_TABLET | Freq: Four times a day (QID) | ORAL | Status: DC | PRN

## 2013-12-25 MED ORDER — METHYLPREDNISOLONE ACETATE 80 MG/ML IJ SUSP
80.0000 mg | Freq: Once | INTRAMUSCULAR | Status: AC
  Administered 2013-12-25: 80 mg via INTRAMUSCULAR

## 2013-12-25 MED ORDER — CLARITHROMYCIN 500 MG PO TABS: 500.0000 mg | ORAL_TABLET | Freq: Two times a day (BID) | ORAL | Status: DC

## 2013-12-25 NOTE — Patient Instructions (Signed)
Take Biaxin 500 mg twice daily for 10 days. Depo-Medrol 80 mg IM given today. Take hydrocodone APAP sparingly for pain.

## 2013-12-25 NOTE — Progress Notes (Signed)
   Subjective:    Patient ID: Cherylynn RidgesKristen H Criswell, female    DOB: 04/15/1986, 28 y.o.   MRN: [id number]  HPI Patient here saying that left ear is very painful. She was given a ten-day course of Augmentin on May 8. Previously  had ear infection in [place]  with Augmentin which she says worked and resolved her pain. This time, she does not feel Augmentin worked  well to clear up the problem. She has had problems taking Levaquin in the past at 500 mg daily. She said it caused some CNS side effects and she had to reduce the dose to 250 mg daily so I am reluctant to use Levaquin. Today complaining of severe left ear pain   Review of Systems     Objective:   Physical Exam  Left TM is injected centrally. The left TM is dull and red but not fire red. Right TM is dull throughout and full.      Assessment & Plan:  Bilateral otitis media  Plan: Biaxin 500 mg twice daily for 10 days. Depo-Medrol 80 mg IM. Norco 5/325 (#30) 1 by mouth every 6-8 hours when necessary ear pain.

## 2013-12-25 NOTE — Telephone Encounter (Signed)
Spoke with patient, appointment given for this afternoon, 5/21 @ 4pm.  Patient confirmed.

## 2013-12-25 NOTE — Telephone Encounter (Signed)
Need to see her this afternoon. Should be better by now.
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
## 2014-02-09 ENCOUNTER — Telehealth: Payer: Self-pay | Admitting: Internal Medicine

## 2014-02-09 NOTE — Telephone Encounter (Signed)
Please arrange for patient to see Dr. Lazarus SalinesWolicki

## 2014-02-10 ENCOUNTER — Telehealth: Payer: Self-pay

## 2014-02-10 NOTE — Telephone Encounter (Signed)
Patient has been scheduled for an appointment with Dr. Lazarus SalinesWolicki on 03/09/2014 at 2:40 pm. Informed of this.

## 2014-02-10 NOTE — Telephone Encounter (Signed)
Scheduled for 03/09/2014 at 2:40pm

## 2014-02-19 ENCOUNTER — Ambulatory Visit (INDEPENDENT_AMBULATORY_CARE_PROVIDER_SITE_OTHER): Payer: BC Managed Care – PPO | Admitting: Internal Medicine

## 2014-02-19 ENCOUNTER — Encounter: Payer: Self-pay | Admitting: Internal Medicine

## 2014-02-19 ENCOUNTER — Telehealth: Payer: Self-pay | Admitting: Internal Medicine

## 2014-02-19 VITALS — BP 116/82 | HR 84 | Temp 99.1°F | Wt 267.0 lb

## 2014-02-19 DIAGNOSIS — H6500 Acute serous otitis media, unspecified ear: Secondary | ICD-10-CM

## 2014-02-19 DIAGNOSIS — H6502 Acute serous otitis media, left ear: Secondary | ICD-10-CM

## 2014-02-19 DIAGNOSIS — H65194 Other acute nonsuppurative otitis media, recurrent, right ear: Secondary | ICD-10-CM

## 2014-02-19 DIAGNOSIS — H65199 Other acute nonsuppurative otitis media, unspecified ear: Secondary | ICD-10-CM

## 2014-02-19 MED ORDER — CLARITHROMYCIN 500 MG PO TABS: 500.0000 mg | ORAL_TABLET | Freq: Two times a day (BID) | ORAL | Status: DC

## 2014-02-19 MED ORDER — PREDNISONE 10 MG PO KIT
PACK | ORAL | Status: DC
Start: 2014-02-19 — End: 2014-05-26

## 2014-02-19 MED ORDER — FLUCONAZOLE 150 MG PO TABS: 150.0000 mg | ORAL_TABLET | Freq: Once | ORAL | Status: DC

## 2014-02-19 NOTE — Telephone Encounter (Signed)
Spoke with patient.  Dr. Lenord FellersBaxley will see patient this afternoon to evaluate.  Patient added to schedule for today at 3:30.

## 2014-02-19 NOTE — Telephone Encounter (Signed)
Needs to be seen

## 2014-02-19 NOTE — Patient Instructions (Addendum)
Take  Biaxin 500 mg twice daily for 10 days. Sterapred DS 10 mg 6 day dosepak. Diflucan if needed for candida vaginitis. Keep appointment with ENT August 3

## 2014-02-19 NOTE — Progress Notes (Signed)
   Subjective:    Patient ID: Dawn Navarro, female    DOB: 03/13/1986, 28 y.o.   MRN: [id number]  HPI  Was seen at Urgent Care in [place] July 4th with left otitis media treated with Steroids for 6 days and Omnicef for 10 days. Just as soon as finished antibiotic yesterday, right ear began to hurt. Left ear has some pressure. Going to beach July 18th for one week. Has appt with ENT August 3rd because of frequent ear infections. She cannot tolerate Levaquin. Had been treated in May with Augmentin out-of-town and subsequently changed to Biaxin and given injection of Depo-Medrol. Seemed to do well with Biaxin.    Review of Systems     Objective:   Physical Exam Right TM is full and red. Left TM is full but not red. Neck is supple. Pharynx is clear. Chest clear.        Assessment & Plan:  Right otitis media  Left serous otitis media  Plan: Biaxin 500 mg twice daily for 10 days. Sterapred DS 10 mg 6 day dosepak. Diflucan if needed for candida vaginitis.

## 2014-05-26 ENCOUNTER — Ambulatory Visit (INDEPENDENT_AMBULATORY_CARE_PROVIDER_SITE_OTHER): Payer: BC Managed Care – PPO | Admitting: Internal Medicine

## 2014-05-26 ENCOUNTER — Encounter: Payer: Self-pay | Admitting: Internal Medicine

## 2014-05-26 VITALS — BP 120/80 | HR 100 | Temp 98.9°F | Wt 292.0 lb

## 2014-05-26 DIAGNOSIS — Z23 Encounter for immunization: Secondary | ICD-10-CM

## 2014-05-26 DIAGNOSIS — F411 Generalized anxiety disorder: Secondary | ICD-10-CM

## 2014-05-26 DIAGNOSIS — E038 Other specified hypothyroidism: Secondary | ICD-10-CM

## 2014-05-26 MED ORDER — SERTRALINE HCL 50 MG PO TABS: 50.0000 mg | ORAL_TABLET | Freq: Every day | ORAL | Status: DC

## 2014-05-26 NOTE — Progress Notes (Signed)
   Subjective:    Patient ID: Dawn RidgesKristen H Geerdes, female    DOB: 05/29/1986, 28 y.o.   MRN: [id number]  HPI  Patient was off Zoloft when she was pregnant with her second child. Now think she needs to restart it. Second child is now 748 months old. Feels she may be having some postpartum depression issues. History of hyperlipidemia. Has not had recent fasting lipid panel. Agrees to come in in the near future for that.    Review of Systems     Objective:   Physical Exam  Not examined. Spent 15 minutes making the patient about these issues.      Assessment & Plan:  Depression-refill Zoloft as previously prescribed.  History of hyperlipidemia  Plan: Restart Zoloft. Come in in early November for fasting lipid panel and TSH.

## 2014-05-26 NOTE — Patient Instructions (Signed)
Continue Zoloft as directed. TSH checked today.

## 2014-05-27 LAB — TSH: TSH: 0.972 u[IU]/mL (ref 0.350–4.500)

## 2014-05-28 ENCOUNTER — Telehealth: Payer: Self-pay

## 2014-05-28 NOTE — Telephone Encounter (Signed)
Message copied by Judd GaudierLEVENS, SHANNON M on Thu May 28, 2014 10:19 AM ------      Message from: Margaree MackintoshBAXLEY, MARY J      Created: Wed May 27, 2014 11:11 AM       Call patient TSH is normal ------

## 2014-05-28 NOTE — Telephone Encounter (Signed)
Patient informed of normal TSH results.

## 2014-06-08 ENCOUNTER — Encounter: Payer: Self-pay | Admitting: Internal Medicine

## 2014-06-15 ENCOUNTER — Ambulatory Visit (INDEPENDENT_AMBULATORY_CARE_PROVIDER_SITE_OTHER): Payer: BC Managed Care – PPO | Admitting: Internal Medicine

## 2014-06-15 ENCOUNTER — Other Ambulatory Visit: Payer: BC Managed Care – PPO | Admitting: Internal Medicine

## 2014-06-15 ENCOUNTER — Encounter: Payer: Self-pay | Admitting: Internal Medicine

## 2014-06-15 VITALS — BP 130/90 | HR 94 | Temp 96.4°F | Wt 292.0 lb

## 2014-06-15 DIAGNOSIS — Z0000 Encounter for general adult medical examination without abnormal findings: Secondary | ICD-10-CM

## 2014-06-15 DIAGNOSIS — Z130 Encounter for screening for diseases of the blood and blood-forming organs and certain disorders involving the immune mechanism: Secondary | ICD-10-CM

## 2014-06-15 DIAGNOSIS — Z1321 Encounter for screening for nutritional disorder: Secondary | ICD-10-CM

## 2014-06-15 DIAGNOSIS — Z13220 Encounter for screening for lipoid disorders: Secondary | ICD-10-CM

## 2014-06-15 DIAGNOSIS — J069 Acute upper respiratory infection, unspecified: Secondary | ICD-10-CM

## 2014-06-15 DIAGNOSIS — E039 Hypothyroidism, unspecified: Secondary | ICD-10-CM

## 2014-06-15 DIAGNOSIS — Z1329 Encounter for screening for other suspected endocrine disorder: Secondary | ICD-10-CM

## 2014-06-15 DIAGNOSIS — E785 Hyperlipidemia, unspecified: Secondary | ICD-10-CM

## 2014-06-15 DIAGNOSIS — H6501 Acute serous otitis media, right ear: Secondary | ICD-10-CM

## 2014-06-15 MED ORDER — CLARITHROMYCIN 500 MG PO TABS: 500.0000 mg | ORAL_TABLET | Freq: Two times a day (BID) | ORAL | Status: DC

## 2014-06-15 MED ORDER — HYDROCODONE-HOMATROPINE 5-1.5 MG/5ML PO SYRP: 5.0000 mL | ORAL_SOLUTION | Freq: Three times a day (TID) | ORAL | Status: DC | PRN

## 2014-06-15 NOTE — Progress Notes (Signed)
   Subjective:    Patient ID: Cherylynn RidgesKristen H Dusenbery, female    DOB: 09/14/1985, 28 y.o.   MRN: [id number]  HPI  Patient in today with acute upper respiratory infection and complaint of right ear pain. She did see ENT physician regarding recurrent ear infections and he told her she did not need grommet tubes at the present time. Son has been diagnosed with respiratory syncytial virus. He was wheezing. Patient not wheezing but came down with acute respiratory infection last week. Has been coughing a lot. Some discolored sputum.    Review of Systems     Objective:   Physical Exam  Pharynx slightly injected. Right TM is dull and centrally injected. Left TM clear. No cervical adenopathy. Neck is supple. Chest clear.      Assessment & Plan:  Acute upper respiratory infection  Acute right otitis media  Plan: Biaxin 500 mg twice daily for 10 days. Hycodan 1 teaspoon by mouth every 6-8 hours when necessary cough. If she continues to have recurrent bouts of otitis media, she will need to see ENT physician again to consider tubes.

## 2014-06-15 NOTE — Patient Instructions (Signed)
Take Biaxin as directed for 10 days and Hycodan as needed for cough.

## 2014-06-16 ENCOUNTER — Ambulatory Visit (INDEPENDENT_AMBULATORY_CARE_PROVIDER_SITE_OTHER): Payer: BC Managed Care – PPO | Admitting: Internal Medicine

## 2014-06-16 ENCOUNTER — Encounter: Payer: Self-pay | Admitting: Internal Medicine

## 2014-06-16 VITALS — BP 110/80 | HR 114 | Temp 98.1°F | Ht 67.0 in | Wt 268.0 lb

## 2014-06-16 DIAGNOSIS — E039 Hypothyroidism, unspecified: Secondary | ICD-10-CM

## 2014-06-16 DIAGNOSIS — E785 Hyperlipidemia, unspecified: Secondary | ICD-10-CM

## 2014-06-16 DIAGNOSIS — E282 Polycystic ovarian syndrome: Secondary | ICD-10-CM

## 2014-06-16 DIAGNOSIS — Z0000 Encounter for general adult medical examination without abnormal findings: Secondary | ICD-10-CM

## 2014-06-16 LAB — POCT URINALYSIS DIPSTICK
BILIRUBIN UA: NEGATIVE
Blood, UA: NEGATIVE
Glucose, UA: NEGATIVE
KETONES UA: NEGATIVE
Leukocytes, UA: NEGATIVE
Nitrite, UA: NEGATIVE
Protein, UA: NEGATIVE
SPEC GRAV UA: 1.02
Urobilinogen, UA: NEGATIVE
pH, UA: 6

## 2014-06-16 LAB — CBC WITH DIFFERENTIAL/PLATELET
BASOS PCT: 0 % (ref 0–1)
Basophils Absolute: 0 10*3/uL (ref 0.0–0.1)
EOS ABS: 0.2 10*3/uL (ref 0.0–0.7)
Eosinophils Relative: 2 % (ref 0–5)
HEMATOCRIT: 39.1 % (ref 36.0–46.0)
HEMOGLOBIN: 12.8 g/dL (ref 12.0–15.0)
Lymphocytes Relative: 25 % (ref 12–46)
Lymphs Abs: 2.2 10*3/uL (ref 0.7–4.0)
MCH: 28.7 pg (ref 26.0–34.0)
MCHC: 32.7 g/dL (ref 30.0–36.0)
MCV: 87.7 fL (ref 78.0–100.0)
MONOS PCT: 7 % (ref 3–12)
Monocytes Absolute: 0.6 10*3/uL (ref 0.1–1.0)
Neutro Abs: 5.9 10*3/uL (ref 1.7–7.7)
Neutrophils Relative %: 66 % (ref 43–77)
Platelets: 357 10*3/uL (ref 150–400)
RBC: 4.46 MIL/uL (ref 3.87–5.11)
RDW: 14.9 % (ref 11.5–15.5)
WBC: 8.9 10*3/uL (ref 4.0–10.5)

## 2014-06-16 LAB — COMPREHENSIVE METABOLIC PANEL
ALBUMIN: 4.2 g/dL (ref 3.5–5.2)
ALK PHOS: 68 U/L (ref 39–117)
ALT: 16 U/L (ref 0–35)
AST: 16 U/L (ref 0–37)
BUN: 7 mg/dL (ref 6–23)
CO2: 25 mEq/L (ref 19–32)
Calcium: 9.6 mg/dL (ref 8.4–10.5)
Chloride: 104 mEq/L (ref 96–112)
Creat: 0.65 mg/dL (ref 0.50–1.10)
GLUCOSE: 81 mg/dL (ref 70–99)
Potassium: 4 mEq/L (ref 3.5–5.3)
Sodium: 140 mEq/L (ref 135–145)
Total Bilirubin: 0.3 mg/dL (ref 0.2–1.2)
Total Protein: 6.6 g/dL (ref 6.0–8.3)

## 2014-06-16 LAB — LIPID PANEL
CHOLESTEROL: 228 mg/dL — AB (ref 0–200)
HDL: 37 mg/dL — ABNORMAL LOW (ref >39)
LDL Cholesterol: 155 mg/dL — ABNORMAL HIGH (ref 0–99)
TRIGLYCERIDES: 179 mg/dL — AB (ref <150)
Total CHOL/HDL Ratio: 6.2 Ratio
VLDL: 36 mg/dL (ref 0–40)

## 2014-06-16 LAB — TSH: TSH: 1.518 u[IU]/mL (ref 0.350–4.500)

## 2014-06-16 LAB — VITAMIN D 25 HYDROXY (VIT D DEFICIENCY, FRACTURES): Vit D, 25-Hydroxy: 30 ng/mL (ref 30–89)

## 2014-06-16 MED ORDER — FLUCONAZOLE 150 MG PO TABS: 150.0000 mg | ORAL_TABLET | Freq: Once | ORAL | Status: DC

## 2014-06-16 NOTE — Patient Instructions (Signed)
Continue diet exercise and weight loss efforts. Return in 6 months for fasting lipid panel and office visit. Continued course of antibiotics prescribed yesterday. Continue same dose of thyroid replacement therapy. Take Diflucan for Candida vaginitis.

## 2014-06-16 NOTE — Progress Notes (Signed)
Subjective:    Patient ID: Dawn Navarro, female    DOB: 11/22/1985, 28 y.o.   MRN: [id number]  HPI  28 year old Female in today for health maintenance exam. She was here yesterday with respiratory infection and right otitis media. Was started on Biaxin and Hycodan.  Has developed Candida vaginitis infection. Diflucan called in to drugstore today.She has a history of hyperlipidemia and previously was on lipid-lowering medication prior to her second pregnancy. In 2013, triglycerides were 290 fasting.  She's been off Lipitor for a number of months. However lipids are beginning to increase. She now has Nexplanon  for contraception which was placed 6 weeks after second pregnancy.History of depression treated with Zoloft.  She is obese. She took Clomid to get pregnant for the first time. History of polycystic ovary syndrome. First child delivered February 2012 by cesarean section for failure to progress. History of hypothyroidism. Has been on thyroid replacement therapy since 2010.  Social history: She is married. 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 Youngest child is 748 months old. 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  [facility]. In March 2012 had Pap smear with ASCUS.  Family history: Mother with history of hypothyroidism. Father with history of non-Hodgkin's lymphoma and a remote history of prostate cancer. 2 brothers in good health.Both parents with history of hyperlipidemia.    Review of Systems  Constitutional: Negative.   HENT:       Right ear discomfort  Eyes: Negative.   Respiratory: Positive for cough.   Cardiovascular: Negative.   Gastrointestinal: Negative.   Neurological: Negative.   Psychiatric/Behavioral: Negative.        Objective:   Physical Exam  Constitutional: She is oriented to person, place, and time. She appears well-developed and well-nourished. No distress.  HENT:  Head: Normocephalic and  atraumatic.  Left Ear: External ear normal.  Mouth/Throat: Oropharynx is clear and moist. No oropharyngeal exudate.  Right otitis media  Eyes: Conjunctivae and EOM are normal. Right eye exhibits no discharge. Left eye exhibits no discharge. No scleral icterus.  Neck: Neck supple. No JVD present. No thyromegaly present.  Cardiovascular: Normal rate, regular rhythm, normal heart sounds and intact distal pulses.   No murmur heard. Pulmonary/Chest: Effort normal and breath sounds normal. No respiratory distress. She has no wheezes. She has no rales. She exhibits no tenderness.  Abdominal: Soft. Bowel sounds are normal. She exhibits no distension and no mass. There is no tenderness. There is no rebound and no guarding.  Genitourinary:  Deferred to GYN  Musculoskeletal: Normal range of motion. She exhibits no edema.  Lymphadenopathy:    She has no cervical adenopathy.  Neurological: She is alert and oriented to person, place, and time. She has normal reflexes. She displays normal reflexes. No cranial nerve deficit. Coordination normal.  Skin: Skin is warm and dry. No rash noted. She is not diaphoretic.  Psychiatric: She has a normal mood and affect. Her behavior is normal. Judgment and thought content normal.  Vitals reviewed.         Assessment & Plan:  Hyperlipidemia-patient needs to begin to take diet and exercise seriously. We've agreed to recheck her lipid panel in 6 months and let her try diet and exercise once again.  Obesity -needs to exercise and diet and lose weight  History polycystic ovary syndrome  History of hypothyroidism- TSH within normal limits  Acute URI and right otitis media on treatment with Biaxin  since yesterday  Depression treated with Zoloft  Plan: Diet exercise and weight loss. Continue thyroid replacement. Diflucan called in for Candida vaginitis while on antibiotics. Recheck lipid panel with office visit in 6 months.

## 2014-07-23 ENCOUNTER — Encounter: Payer: Self-pay | Admitting: Internal Medicine

## 2014-07-23 ENCOUNTER — Ambulatory Visit (INDEPENDENT_AMBULATORY_CARE_PROVIDER_SITE_OTHER): Payer: BC Managed Care – PPO | Admitting: Internal Medicine

## 2014-07-23 VITALS — BP 118/78 | HR 112 | Temp 97.8°F | Wt 289.0 lb

## 2014-07-23 DIAGNOSIS — N390 Urinary tract infection, site not specified: Secondary | ICD-10-CM

## 2014-07-23 LAB — POCT URINALYSIS DIPSTICK
Bilirubin, UA: NEGATIVE
Glucose, UA: NEGATIVE
Ketones, UA: NEGATIVE
Leukocytes, UA: NEGATIVE
Nitrite, UA: NEGATIVE
PROTEIN UA: NEGATIVE
RBC UA: NEGATIVE
Spec Grav, UA: 1.01
UROBILINOGEN UA: NEGATIVE
pH, UA: 6

## 2014-07-23 MED ORDER — FLUCONAZOLE 150 MG PO TABS: 150.0000 mg | ORAL_TABLET | Freq: Once | ORAL | Status: DC

## 2014-07-23 MED ORDER — HYDROCODONE-HOMATROPINE 5-1.5 MG/5ML PO SYRP: 5.0000 mL | ORAL_SOLUTION | Freq: Three times a day (TID) | ORAL | Status: DC | PRN

## 2014-07-23 MED ORDER — CLARITHROMYCIN 500 MG PO TABS: 500.0000 mg | ORAL_TABLET | Freq: Two times a day (BID) | ORAL | Status: DC

## 2014-07-23 NOTE — Progress Notes (Signed)
   Subjective:    Patient ID: Dawn Navarro, female    DOB: 10/15/1985, 28 y.o.   MRN: [id number]  HPI Her child who is in preschool has another respiratory infection. She has come down with similar symptoms. Has had a deep congested cough. No fever or shaking chills. Left ear still not completely well. Was seen and treated with Biaxin and Hycodan cough syrup on November 9th. No fever or shaking chills.  Also has another problem. Complaining of urinary frequency. No vaginal itching. Concerned about possible UTI.    Review of Systems     Objective:   Physical Exam  Left TM is dull and thick but not red. Right TM is okay. Pharynx slightly injected. Neck supple. Chest clear to auscultation.      Assessment & Plan:  Persistent left otitis media  Acute URI  Urinary frequency-urinalysis is normal  Plan: Biaxin 500 mg twice daily for 10 days. Hycodan syrup 1 teaspoon by mouth every 6-8 hours when necessary cough. Refill Diflucan should she come down with Candida vaginitis while on antibiotics. May try Delsym cough syrup during the day.

## 2014-07-23 NOTE — Patient Instructions (Addendum)
Take Biaxin 500 mg twice daily for 10 days. Hycodan refilled. Diflucan refilled. Urinalysis is normal.

## 2014-07-28 ENCOUNTER — Telehealth: Payer: Self-pay | Admitting: Internal Medicine

## 2014-07-28 NOTE — Telephone Encounter (Signed)
Patient scheduled to see Dr Lenord FellersBaxley at 10:30 07/29/14

## 2014-07-28 NOTE — Telephone Encounter (Signed)
Still coughing a lot.  Says that she's still having terrible time at night coughing.  She's drinking plenty of fluids to loosen secretions.  She's propping up in bed on two pillows.  She hasn't finished antibiotic yet.  She wants to know if there's anything else besides the Hycodan that you can give her that would provide any additional relief besides the Hycodan.  Still coughing pretty bad.    Other than the cough, say she still has some chest burning, but not near as bad as when she came to see you.  No fever.  Ears are not hurting, no sore throat.    Please advise.

## 2014-07-28 NOTE — Telephone Encounter (Signed)
See tomorrow

## 2014-07-29 ENCOUNTER — Ambulatory Visit (INDEPENDENT_AMBULATORY_CARE_PROVIDER_SITE_OTHER): Payer: BC Managed Care – PPO | Admitting: Internal Medicine

## 2014-07-29 ENCOUNTER — Encounter: Payer: Self-pay | Admitting: Internal Medicine

## 2014-07-29 VITALS — BP 128/78 | HR 110 | Temp 97.3°F | Wt 287.0 lb

## 2014-07-29 DIAGNOSIS — H6503 Acute serous otitis media, bilateral: Secondary | ICD-10-CM

## 2014-07-29 DIAGNOSIS — J069 Acute upper respiratory infection, unspecified: Secondary | ICD-10-CM

## 2014-07-29 MED ORDER — HYDROCOD POLST-CHLORPHEN POLST 10-8 MG/5ML PO LQCR: 5.0000 mL | Freq: Two times a day (BID) | ORAL | Status: DC | PRN

## 2014-07-29 MED ORDER — PREDNISONE 10 MG PO KIT: PACK | ORAL | Status: DC

## 2014-07-29 MED ORDER — CEFTRIAXONE SODIUM 1 G IJ SOLR
1.0000 g | Freq: Once | INTRAMUSCULAR | Status: AC
  Administered 2014-07-29: 1 g via INTRAMUSCULAR

## 2014-09-28 ENCOUNTER — Encounter: Payer: Self-pay | Admitting: Internal Medicine

## 2014-09-28 ENCOUNTER — Ambulatory Visit (INDEPENDENT_AMBULATORY_CARE_PROVIDER_SITE_OTHER): Payer: BLUE CROSS/BLUE SHIELD | Admitting: Internal Medicine

## 2014-09-28 VITALS — BP 108/78 | HR 91 | Temp 97.4°F | Wt 276.0 lb

## 2014-09-28 DIAGNOSIS — H6690 Otitis media, unspecified, unspecified ear: Secondary | ICD-10-CM | POA: Insufficient documentation

## 2014-09-28 DIAGNOSIS — A0811 Acute gastroenteropathy due to Norwalk agent: Secondary | ICD-10-CM | POA: Diagnosis not present

## 2014-09-28 DIAGNOSIS — H6503 Acute serous otitis media, bilateral: Secondary | ICD-10-CM

## 2014-09-28 MED ORDER — CLARITHROMYCIN 500 MG PO TABS: 500.0000 mg | ORAL_TABLET | Freq: Two times a day (BID) | ORAL | Status: DC

## 2014-09-28 MED ORDER — FLUCONAZOLE 150 MG PO TABS: 150.0000 mg | ORAL_TABLET | Freq: Once | ORAL | Status: DC

## 2014-09-28 NOTE — Progress Notes (Signed)
   Subjective:    Patient ID: Dawn Navarro, female    DOB: 03/27/1986, 29 y.o.   MRN: [id number]  HPI Patient had gastroenteritis symptoms last week for 4 days. Started to get better on Wednesday, February 17. It went through her entire household. Subsequently has developed right ear pain. History of recurrent right otitis media. In November was treated with Biaxin and did well. She did see ENT physician who did not feel like she needed a grommet tube for recurrent otitis media. She has no cough, fever or chills.    Review of Systems     Objective:   Physical Exam  Pharynx is slightly injected. Both TMs are dull bilaterally. Neck is supple without adenopathy. Chest clear to auscultation without rales or wheezing      Assessment & Plan:  Gastroenteritis-resolved  Bilateral otitis media  Plan: Biaxin 500 mg twice daily for 10 days. Diflucan to take should she get Candida vaginitis while on antibiotics.

## 2014-09-28 NOTE — Patient Instructions (Signed)
Diflucan if needed for Candida vaginitis. Biaxin 500 mg twice daily for 10 days.

## 2014-10-05 NOTE — Progress Notes (Signed)
   Subjective:    Patient ID: Dawn Navarro, female    DOB: 07/30/1986, 29 y.o.   MRN: [id number]  HPI  In today with URI symptoms. Has malaise and complaining of discolored sputum production. Think she may have bronchitis. History of bouts of recurrent otitis media.    Review of Systems     Objective:   Physical Exam Pharynx slightly injected. Neck is supple without adenopathy. Chest clear to auscultation. TMs are full bilaterally.       Assessment & Plan:  Bilateral serous otitis media  Acute bronchitis  Plan: 1 g IM Rocephin. Biaxin 500 mg twice daily for 10 days.

## 2014-10-05 NOTE — Patient Instructions (Addendum)
Rocephin given today. Take Biaxin 500 mg twice daily for 10 days.

## 2014-10-26 ENCOUNTER — Encounter: Payer: Self-pay | Admitting: Internal Medicine

## 2014-10-26 ENCOUNTER — Ambulatory Visit (INDEPENDENT_AMBULATORY_CARE_PROVIDER_SITE_OTHER): Payer: BLUE CROSS/BLUE SHIELD | Admitting: Internal Medicine

## 2014-10-26 VITALS — BP 110/80 | HR 90 | Temp 97.8°F | Wt 271.0 lb

## 2014-10-26 DIAGNOSIS — H6501 Acute serous otitis media, right ear: Secondary | ICD-10-CM

## 2014-10-26 DIAGNOSIS — J069 Acute upper respiratory infection, unspecified: Secondary | ICD-10-CM

## 2014-10-26 DIAGNOSIS — J029 Acute pharyngitis, unspecified: Secondary | ICD-10-CM | POA: Diagnosis not present

## 2014-10-26 LAB — POCT RAPID STREP A (OFFICE): Rapid Strep A Screen: NEGATIVE

## 2014-10-26 MED ORDER — HYDROCOD POLST-CHLORPHEN POLST 10-8 MG/5ML PO LQCR: 5.0000 mL | Freq: Two times a day (BID) | ORAL | Status: DC | PRN

## 2014-10-26 MED ORDER — CLARITHROMYCIN 500 MG PO TABS: 500.0000 mg | ORAL_TABLET | Freq: Two times a day (BID) | ORAL | Status: DC

## 2014-10-26 NOTE — Addendum Note (Signed)
Addended by: Thomasena EdisWILLIAMS, Trissa Molina N on: 10/26/2014 03:16 PM   Modules accepted: Orders

## 2014-10-26 NOTE — Patient Instructions (Signed)
Take Biaxin and Tussionex as directed

## 2014-10-26 NOTE — Progress Notes (Signed)
   Subjective:    Patient ID: Dawn Navarro, female    DOB: 08/29/1985, 29 y.o.   MRN: [id number]  HPI 2 day history of URI symptoms. Children have had similar illness. Sore throat cough and congestion. No fever or shaking chills. Was here February 22 with a respiratory infection and ear infection.    Review of Systems     Objective:   Physical Exam  Right TM is full but not red. Left TM clear. Pharynx is injected without exudate. Rapid strep screen is negative. Chest is clear to auscultation. Neck is supple without adenopathy.      Assessment & Plan:  Pharyngitis  Acute URI  Right serous otitis media  Plan: Biaxin 500 mg twice daily for 10 days. Tussionex ( 115 mL) 1 teaspoon by mouth every 12 hours when necessary cough.

## 2014-10-27 ENCOUNTER — Telehealth: Payer: Self-pay | Admitting: Internal Medicine

## 2014-10-27 MED ORDER — FLUCONAZOLE 150 MG PO TABS: 150.0000 mg | ORAL_TABLET | Freq: Once | ORAL | Status: DC

## 2014-10-27 NOTE — Telephone Encounter (Signed)
Please prescribe Diflucan with one refill

## 2014-10-27 NOTE — Telephone Encounter (Signed)
Patient notified script sent in for Diflucan to patient pharmacy

## 2014-10-27 NOTE — Telephone Encounter (Signed)
Pt has gotten a yeast infection from the antibiotic prescribed 10/26/14, would like the miconizole pill form if possible.  Please send / [facility]

## 2014-11-10 ENCOUNTER — Other Ambulatory Visit: Payer: Self-pay | Admitting: Internal Medicine

## 2014-12-17 ENCOUNTER — Other Ambulatory Visit: Payer: Self-pay | Admitting: Internal Medicine

## 2014-12-17 ENCOUNTER — Other Ambulatory Visit: Payer: BLUE CROSS/BLUE SHIELD | Admitting: Internal Medicine

## 2014-12-17 DIAGNOSIS — Z13220 Encounter for screening for lipoid disorders: Secondary | ICD-10-CM

## 2014-12-17 LAB — LIPID PANEL
CHOL/HDL RATIO: 6.9 ratio
CHOLESTEROL: 229 mg/dL — AB (ref 0–200)
HDL: 33 mg/dL — ABNORMAL LOW (ref >=46)
LDL Cholesterol: 145 mg/dL — ABNORMAL HIGH (ref 0–99)
Triglycerides: 253 mg/dL — ABNORMAL HIGH (ref <150)
VLDL: 51 mg/dL — AB (ref 0–40)

## 2014-12-18 ENCOUNTER — Encounter: Payer: Self-pay | Admitting: Internal Medicine

## 2014-12-18 ENCOUNTER — Ambulatory Visit (INDEPENDENT_AMBULATORY_CARE_PROVIDER_SITE_OTHER): Payer: BLUE CROSS/BLUE SHIELD | Admitting: Internal Medicine

## 2014-12-18 VITALS — BP 112/76 | HR 92 | Temp 98.2°F | Ht 67.0 in | Wt 282.0 lb

## 2014-12-18 DIAGNOSIS — E785 Hyperlipidemia, unspecified: Secondary | ICD-10-CM | POA: Diagnosis not present

## 2014-12-18 DIAGNOSIS — H65114 Acute and subacute allergic otitis media (mucoid) (sanguinous) (serous), recurrent, right ear: Secondary | ICD-10-CM

## 2014-12-18 DIAGNOSIS — E039 Hypothyroidism, unspecified: Secondary | ICD-10-CM | POA: Diagnosis not present

## 2014-12-18 DIAGNOSIS — G44219 Episodic tension-type headache, not intractable: Secondary | ICD-10-CM

## 2014-12-18 LAB — TSH: TSH: 0.759 u[IU]/mL (ref 0.350–4.500)

## 2014-12-18 MED ORDER — METHYLPREDNISOLONE ACETATE 80 MG/ML IJ SUSP
80.0000 mg | Freq: Once | INTRAMUSCULAR | Status: AC
  Administered 2014-12-18: 80 mg via INTRAMUSCULAR

## 2014-12-18 MED ORDER — ATORVASTATIN CALCIUM 10 MG PO TABS: 10.0000 mg | ORAL_TABLET | Freq: Every day | ORAL | Status: DC

## 2014-12-18 MED ORDER — CLARITHROMYCIN 500 MG PO TABS: 500.0000 mg | ORAL_TABLET | Freq: Two times a day (BID) | ORAL | Status: DC

## 2014-12-18 MED ORDER — BUTALBITAL-APAP-CAFFEINE 50-325-40 MG PO TABS: ORAL_TABLET | ORAL | Status: DC

## 2014-12-18 MED ORDER — FLUCONAZOLE 150 MG PO TABS
150.0000 mg | ORAL_TABLET | Freq: Once | ORAL | Status: DC
Start: 2014-12-18 — End: 2015-01-12

## 2014-12-18 NOTE — Addendum Note (Signed)
Addended by: Thomasena EdisWILLIAMS, Mikena Masoner N on: 12/18/2014 11:22 AM   Modules accepted: Orders

## 2014-12-18 NOTE — Patient Instructions (Signed)
Restart Lipitor 10 mg daily. TSH checked today as well as screening for diabetes. Return in 3 months for office visit lipid panel liver functions. Take Fioricet sparingly for headache. Take Biaxin for respiratory infection. Depo-Medrol given today.

## 2014-12-18 NOTE — Progress Notes (Signed)
   Subjective:    Patient ID: Dawn RidgesKristen H Navarro, female    DOB: 10/20/1985, 29 y.o.   MRN: [id number]  HPI Says that in mid April she was seen at [facility] urgent [facility] and treated with Biaxin, an unknown cough syrup, Depo-Medrol and given an inhaler.  Was having respiratory infection issues and ear trouble. Once again has pain in right ear. Says allergies of been awful this season.  She is really here today for follow-up on hypothyroidism and hyperlipidemia. She currently is not on lipid-lowering medication. Before getting pregnant a while back, she was on Lipitor 10 mg daily.  She has significant elevated triglycerides and total cholesterol as well as LDL cholesterol. I think she needs to go back on Lipitor 10 mg daily. These follow-up in 3 months.  Alsobe screened for diabetes  Also has headache today. Has old prescription for Fioricet generic which will be refilled. She says this works well for her and she only has to take one tablet and go to bed.    Review of Systems     Objective:   Physical Exam Skin warm and dry. Nodes none. Right TM dull but not red. Left TM clear. Pharynx clear. Neck supple without thyromegaly. Chest clear to auscultation without rales or wheezing.       Assessment & Plan:  Acute right otitis media-recurrent  Headache-tension versus migraine  Hyperlipidemia-total cholesterol 228, triglycerides 179, LDL cholesterol 155.  Hypothyroidism-add TSH to lab work  Obesity-needs to take diet and exercise seriously  Screen for diabetes with elevated triglycerides  Allergic rhinitis  Plan: Depo-Medrol 80 mg IM. Biaxin 500 mg twice daily for 10 days. Fioricet #20 one or 2 by mouth every 6 hours when necessary headache not to exceed 6 tablets in 24 hours. Start Lipitor 10 mg daily and follow-up in 3 months with lipid panel liver functions and office visit. Diflucan should she get Candida vaginitis while on antibiotic.

## 2014-12-19 LAB — HEMOGLOBIN A1C
HEMOGLOBIN A1C: 6 % — AB (ref <5.7)
MEAN PLASMA GLUCOSE: 126 mg/dL — AB (ref <117)

## 2014-12-21 ENCOUNTER — Telehealth: Payer: Self-pay | Admitting: *Deleted

## 2014-12-21 NOTE — Telephone Encounter (Signed)
Reviewed lab results wit patient she will modify her diet and start walking for exercise. Patient will have Hgb A1C rechecked in 6 months and if still elevated will see dietician

## 2015-01-12 ENCOUNTER — Encounter: Payer: Self-pay | Admitting: Internal Medicine

## 2015-01-12 ENCOUNTER — Ambulatory Visit (INDEPENDENT_AMBULATORY_CARE_PROVIDER_SITE_OTHER): Payer: BLUE CROSS/BLUE SHIELD | Admitting: Internal Medicine

## 2015-01-12 VITALS — BP 116/74 | HR 79 | Temp 98.3°F | Wt 281.0 lb

## 2015-01-12 DIAGNOSIS — J029 Acute pharyngitis, unspecified: Secondary | ICD-10-CM | POA: Diagnosis not present

## 2015-01-12 LAB — POCT RAPID STREP A (OFFICE): RAPID STREP A SCREEN: NEGATIVE

## 2015-01-12 MED ORDER — HYDROCODONE-HOMATROPINE 5-1.5 MG/5ML PO SYRP: 5.0000 mL | ORAL_SOLUTION | Freq: Three times a day (TID) | ORAL | Status: DC | PRN

## 2015-01-12 MED ORDER — CLARITHROMYCIN 500 MG PO TABS: 500.0000 mg | ORAL_TABLET | Freq: Two times a day (BID) | ORAL | Status: DC

## 2015-01-12 NOTE — Progress Notes (Signed)
   Subjective:    Patient ID: Dawn Navarro, female    DOB: 04/22/1986, 29 y.o.   MRN: [id number]  HPI  [place] coming weekend [place]. She and her husband are going together without children. One of the children has been sick recently and she has yet another URI. She has cough and congestion. Sputum is discolored. No ear pain. No fever or shaking chills. Doesn't want to be sick traveling. Was exposed to strep throat recently.     Review of Systems     Objective:   Physical Exam  Skin warm and dry. Nodes none. TMs are clear bilaterally. Neck is supple. Chest clear to auscultation without rales or wheezing. Rapid strep screen is negative.      Assessment & Plan:  Acute URI  Plan: Biaxin 500 mg twice daily for 10 days. Do not take cholesterol-lowering medication while on Biaxin. Hycodan 1 teaspoon by mouth every 8 hours when necessary cough. Patient gets frequent respiratory infections. I have suggested she take vitamin C 500 mg daily and echinacea daily. This may help her immunity.

## 2015-01-12 NOTE — Patient Instructions (Addendum)
Do not take lipid med while on Biaxin. Take Biaxin 500 mg twice daily for 10 days. Take Hycodan as needed for cough. Obtain vitamin C 500 mg and take daily along with Echinacea.

## 2015-02-04 ENCOUNTER — Other Ambulatory Visit: Payer: Self-pay | Admitting: Internal Medicine

## 2015-02-05 NOTE — Telephone Encounter (Signed)
Refill once 

## 2015-03-18 ENCOUNTER — Other Ambulatory Visit: Payer: BLUE CROSS/BLUE SHIELD | Admitting: Internal Medicine

## 2015-03-19 ENCOUNTER — Ambulatory Visit: Payer: BLUE CROSS/BLUE SHIELD | Admitting: Internal Medicine

## 2015-04-01 ENCOUNTER — Other Ambulatory Visit: Payer: BLUE CROSS/BLUE SHIELD | Admitting: Internal Medicine

## 2015-04-01 ENCOUNTER — Other Ambulatory Visit: Payer: Self-pay | Admitting: Internal Medicine

## 2015-04-01 DIAGNOSIS — E785 Hyperlipidemia, unspecified: Secondary | ICD-10-CM

## 2015-04-01 DIAGNOSIS — Z79899 Other long term (current) drug therapy: Secondary | ICD-10-CM

## 2015-04-01 LAB — LIPID PANEL
Cholesterol: 146 mg/dL (ref 125–200)
HDL: 26 mg/dL — AB (ref >=46)
LDL CALC: 78 mg/dL (ref <130)
Total CHOL/HDL Ratio: 5.6 Ratio — ABNORMAL HIGH (ref <=5.0)
Triglycerides: 210 mg/dL — ABNORMAL HIGH (ref <150)
VLDL: 42 mg/dL — ABNORMAL HIGH (ref <30)

## 2015-04-01 LAB — HEPATIC FUNCTION PANEL
ALK PHOS: 42 U/L (ref 33–115)
ALT: 9 U/L (ref 6–29)
AST: 13 U/L (ref 10–30)
Albumin: 3.8 g/dL (ref 3.6–5.1)
BILIRUBIN DIRECT: 0.1 mg/dL (ref <=0.2)
BILIRUBIN INDIRECT: 0.2 mg/dL (ref 0.2–1.2)
TOTAL PROTEIN: 5.6 g/dL — AB (ref 6.1–8.1)
Total Bilirubin: 0.3 mg/dL (ref 0.2–1.2)

## 2015-04-08 ENCOUNTER — Ambulatory Visit (INDEPENDENT_AMBULATORY_CARE_PROVIDER_SITE_OTHER): Payer: BLUE CROSS/BLUE SHIELD | Admitting: Internal Medicine

## 2015-04-08 ENCOUNTER — Encounter: Payer: Self-pay | Admitting: Internal Medicine

## 2015-04-08 VITALS — BP 112/78 | HR 87 | Temp 97.9°F | Wt 289.0 lb

## 2015-04-08 DIAGNOSIS — E785 Hyperlipidemia, unspecified: Secondary | ICD-10-CM | POA: Diagnosis not present

## 2015-04-08 DIAGNOSIS — E039 Hypothyroidism, unspecified: Secondary | ICD-10-CM | POA: Diagnosis not present

## 2015-04-08 DIAGNOSIS — B373 Candidiasis of vulva and vagina: Secondary | ICD-10-CM

## 2015-04-08 DIAGNOSIS — E669 Obesity, unspecified: Secondary | ICD-10-CM

## 2015-04-08 DIAGNOSIS — B3731 Acute candidiasis of vulva and vagina: Secondary | ICD-10-CM

## 2015-04-08 DIAGNOSIS — J069 Acute upper respiratory infection, unspecified: Secondary | ICD-10-CM | POA: Diagnosis not present

## 2015-04-08 DIAGNOSIS — R7302 Impaired glucose tolerance (oral): Secondary | ICD-10-CM

## 2015-04-08 MED ORDER — CLARITHROMYCIN 500 MG PO TABS: 500.0000 mg | ORAL_TABLET | Freq: Two times a day (BID) | ORAL | Status: DC

## 2015-04-08 MED ORDER — ATORVASTATIN CALCIUM 20 MG PO TABS: 20.0000 mg | ORAL_TABLET | Freq: Every day | ORAL | Status: DC

## 2015-04-08 MED ORDER — TERCONAZOLE 0.8 % VA CREA: 1.0000 | TOPICAL_CREAM | Freq: Every day | VAGINAL | Status: DC

## 2015-04-08 MED ORDER — HYDROCODONE-HOMATROPINE 5-1.5 MG/5ML PO SYRP: 5.0000 mL | ORAL_SOLUTION | Freq: Three times a day (TID) | ORAL | Status: DC | PRN

## 2015-04-08 NOTE — Progress Notes (Signed)
   Subjective:    Patient ID: Dawn Navarro, female    DOB: Jun 06, 1986, 29 y.o.   MRN: [id number]  HPI  Is here today to follow-up on hyperlipidemia on Lipitor 10 mg daily. Triglycerides remain elevated but overall total cholesterol and LDL cholesterol are within normal limits. I am going to increase Lipitor to 20 mg daily and we will recheck at time of physical exam in January. Her hemoglobin A1c and TSH were checked in May. Went over those results with her once again. She's been having issues with recurrent Candida vaginitis. Is out of Diflucan refills. Suggested she try Terazol 7 vaginal cream daily at bedtime 7 days on a when necessary basis instead of Diflucan. Reminded her that taking antibody frequently would cause Candida vaginitis due to imbalance of bacteria and yeast in vagina.  Says she is cut out sodas and feels that her A1c will have improved.  Complains of acute URI symptoms once again. Children have been sick. Has sore throat and coughed all night. No fever or shaking chills. Ears feel full.    Review of Systems     Objective:   Physical Exam Skin warm and dry. No thyromegaly. No adenopathy. TMs slightly dull but not red or full. Pharynx not injected. Neck supple. Chest clear to auscultation without rales or wheezing.       Assessment & Plan:  Acute URI-treat with Biaxin 500 mg twice daily for 10 days. Hycodan 1 teaspoon by mouth every 8 hours when necessary cough.  Hyperlipidemia-increase Lipitor to 20 mg daily return in January for physical exam and lab work  Obesity-continued diet exercise and weight also efforts-recheck in January  Hypothyroidism-recheck in January. TSH was normal in May  Candida vaginitis-try Terazol 7 vaginal cream instead of Diflucan  Impaired glucose tolerance-recheck in January continue diet and exercise. Hemoglobin A1c in May was 6%

## 2015-04-08 NOTE — Patient Instructions (Addendum)
Increase Lipitor to 20 mg daily. Return January for labs and physical examination. Try Terazol 7 vaginal cream instead of Diflucan for yeast infections which are recurrent and likely related to multiple anabiotic treatments. Hemoglobin A 1C and TSH were checked in May and and were normal. Will be repeated in January. Take Hycodan as needed for cough. Take Biaxin. Do not take Lipitor while on Biaxin.

## 2015-04-26 ENCOUNTER — Other Ambulatory Visit: Payer: Self-pay

## 2015-04-26 MED ORDER — FLUCONAZOLE 150 MG PO TABS: 150.0000 mg | ORAL_TABLET | Freq: Once | ORAL | Status: DC

## 2015-04-26 NOTE — Telephone Encounter (Signed)
Patient called saying the cream was not clearing up her infection.  Per Dr Lenord Fellers, diflucan refilled.

## 2015-05-13 ENCOUNTER — Other Ambulatory Visit: Payer: Self-pay | Admitting: Internal Medicine

## 2015-05-14 NOTE — Telephone Encounter (Signed)
Refill once 

## 2015-06-04 ENCOUNTER — Other Ambulatory Visit: Payer: Self-pay | Admitting: Internal Medicine

## 2015-06-29 ENCOUNTER — Other Ambulatory Visit: Payer: Self-pay | Admitting: Internal Medicine

## 2015-06-30 NOTE — Telephone Encounter (Signed)
Refill once 

## 2015-07-09 ENCOUNTER — Other Ambulatory Visit: Payer: Self-pay | Admitting: Internal Medicine

## 2015-07-12 NOTE — Telephone Encounter (Signed)
Refill once 

## 2015-07-15 ENCOUNTER — Encounter: Payer: Self-pay | Admitting: Internal Medicine

## 2015-07-15 ENCOUNTER — Ambulatory Visit (INDEPENDENT_AMBULATORY_CARE_PROVIDER_SITE_OTHER): Payer: BLUE CROSS/BLUE SHIELD | Admitting: Internal Medicine

## 2015-07-15 VITALS — BP 120/84 | HR 102 | Temp 98.1°F | Resp 22 | Wt 292.0 lb

## 2015-07-15 DIAGNOSIS — R21 Rash and other nonspecific skin eruption: Secondary | ICD-10-CM

## 2015-07-15 MED ORDER — DOXYCYCLINE HYCLATE 100 MG PO TABS: 100.0000 mg | ORAL_TABLET | Freq: Two times a day (BID) | ORAL | Status: DC

## 2015-07-15 MED ORDER — HYDROCORTISONE 2.5 % EX CREA: TOPICAL_CREAM | Freq: Two times a day (BID) | CUTANEOUS | Status: DC

## 2015-08-07 ENCOUNTER — Encounter: Payer: Self-pay | Admitting: Internal Medicine

## 2015-08-07 NOTE — Progress Notes (Signed)
   Subjective:    Patient ID: Dawn Navarro, female    DOB: 07/27/1986, 29 y.o.   MRN: [id number]  HPI She has developed a rash on her arms. Has not changed soaps or detergents. Not sure what has caused the rash. Rash has been itchy at times. No new medications. No new makeup or cleansers.    Review of Systems     Objective:   Physical Exam Rash is erythematous and discrete. Lesions are not confluent. Some areas on neck and face.        Assessment & Plan:  Nonspecific dermatitis  Plan: Hydrocortisone 2.5% cream apply sparingly to face and neck 3 times a day. Doxycycline 100 mg twice daily for 10 days. See dermatologist if lesions do not improve.

## 2015-08-07 NOTE — Patient Instructions (Signed)
Apply hydrocortisone 2.5% cream sparingly 3 times a day. Take doxycycline 100 mg twice daily for 10 days. Will need to see dermatologist if symptoms do not resolve.

## 2015-09-06 ENCOUNTER — Other Ambulatory Visit: Payer: BLUE CROSS/BLUE SHIELD | Admitting: Internal Medicine

## 2015-09-06 ENCOUNTER — Encounter: Payer: Self-pay | Admitting: Internal Medicine

## 2015-09-07 ENCOUNTER — Encounter: Payer: BLUE CROSS/BLUE SHIELD | Admitting: Internal Medicine

## 2015-09-27 ENCOUNTER — Other Ambulatory Visit: Payer: Self-pay | Admitting: Internal Medicine

## 2015-09-27 NOTE — Telephone Encounter (Signed)
Give #30 with no refill 

## 2015-09-28 ENCOUNTER — Other Ambulatory Visit: Payer: BLUE CROSS/BLUE SHIELD | Admitting: Internal Medicine

## 2015-09-28 DIAGNOSIS — Z79899 Other long term (current) drug therapy: Secondary | ICD-10-CM

## 2015-09-28 DIAGNOSIS — Z0000 Encounter for general adult medical examination without abnormal findings: Secondary | ICD-10-CM

## 2015-09-28 DIAGNOSIS — R7309 Other abnormal glucose: Secondary | ICD-10-CM

## 2015-09-28 DIAGNOSIS — E039 Hypothyroidism, unspecified: Secondary | ICD-10-CM

## 2015-09-28 DIAGNOSIS — E785 Hyperlipidemia, unspecified: Secondary | ICD-10-CM

## 2015-09-28 LAB — CBC WITH DIFFERENTIAL/PLATELET
BASOS PCT: 0 % (ref 0–1)
Basophils Absolute: 0 10*3/uL (ref 0.0–0.1)
EOS PCT: 2 % (ref 0–5)
Eosinophils Absolute: 0.2 10*3/uL (ref 0.0–0.7)
HCT: 38.9 % (ref 36.0–46.0)
HEMOGLOBIN: 13.1 g/dL (ref 12.0–15.0)
Lymphocytes Relative: 24 % (ref 12–46)
Lymphs Abs: 1.8 10*3/uL (ref 0.7–4.0)
MCH: 30.2 pg (ref 26.0–34.0)
MCHC: 33.7 g/dL (ref 30.0–36.0)
MCV: 89.6 fL (ref 78.0–100.0)
MONO ABS: 0.5 10*3/uL (ref 0.1–1.0)
MONOS PCT: 6 % (ref 3–12)
MPV: 10.2 fL (ref 8.6–12.4)
NEUTROS ABS: 5.2 10*3/uL (ref 1.7–7.7)
Neutrophils Relative %: 68 % (ref 43–77)
Platelets: 343 10*3/uL (ref 150–400)
RBC: 4.34 MIL/uL (ref 3.87–5.11)
RDW: 14 % (ref 11.5–15.5)
WBC: 7.7 10*3/uL (ref 4.0–10.5)

## 2015-09-28 LAB — HEMOGLOBIN A1C
HEMOGLOBIN A1C: 5.8 % — AB (ref <5.7)
Mean Plasma Glucose: 120 mg/dL — ABNORMAL HIGH (ref <117)

## 2015-09-28 NOTE — Telephone Encounter (Signed)
Phoned to pharmacy 

## 2015-09-29 LAB — COMPLETE METABOLIC PANEL WITH GFR
ALBUMIN: 3.7 g/dL (ref 3.6–5.1)
ALK PHOS: 50 U/L (ref 33–115)
ALT: 9 U/L (ref 6–29)
AST: 12 U/L (ref 10–30)
BUN: 9 mg/dL (ref 7–25)
CALCIUM: 8.9 mg/dL (ref 8.6–10.2)
CO2: 25 mmol/L (ref 20–31)
Chloride: 107 mmol/L (ref 98–110)
Creat: 0.67 mg/dL (ref 0.50–1.10)
GFR, Est African American: >89 mL/min (ref >=60)
Glucose, Bld: 86 mg/dL (ref 65–99)
POTASSIUM: 4.4 mmol/L (ref 3.5–5.3)
SODIUM: 142 mmol/L (ref 135–146)
Total Bilirubin: 0.3 mg/dL (ref 0.2–1.2)
Total Protein: 6.5 g/dL (ref 6.1–8.1)

## 2015-09-29 LAB — LIPID PANEL
CHOLESTEROL: 164 mg/dL (ref 125–200)
HDL: 52 mg/dL (ref >=46)
LDL Cholesterol: 77 mg/dL (ref <130)
TRIGLYCERIDES: 173 mg/dL — AB (ref <150)
Total CHOL/HDL Ratio: 3.2 Ratio (ref <=5.0)
VLDL: 35 mg/dL — ABNORMAL HIGH (ref <30)

## 2015-09-29 LAB — TSH: TSH: 0.66 mIU/L

## 2015-09-30 ENCOUNTER — Encounter: Payer: Self-pay | Admitting: Internal Medicine

## 2015-09-30 ENCOUNTER — Ambulatory Visit (INDEPENDENT_AMBULATORY_CARE_PROVIDER_SITE_OTHER): Payer: BLUE CROSS/BLUE SHIELD | Admitting: Internal Medicine

## 2015-09-30 VITALS — BP 108/68 | HR 114 | Temp 98.4°F | Resp 20 | Ht 67.0 in | Wt 291.0 lb

## 2015-09-30 DIAGNOSIS — L410 Pityriasis lichenoides et varioliformis acuta: Secondary | ICD-10-CM

## 2015-09-30 DIAGNOSIS — Z0000 Encounter for general adult medical examination without abnormal findings: Secondary | ICD-10-CM

## 2015-09-30 DIAGNOSIS — E282 Polycystic ovarian syndrome: Secondary | ICD-10-CM

## 2015-09-30 DIAGNOSIS — F329 Major depressive disorder, single episode, unspecified: Secondary | ICD-10-CM

## 2015-09-30 DIAGNOSIS — E669 Obesity, unspecified: Secondary | ICD-10-CM | POA: Diagnosis not present

## 2015-09-30 DIAGNOSIS — E039 Hypothyroidism, unspecified: Secondary | ICD-10-CM

## 2015-09-30 DIAGNOSIS — E785 Hyperlipidemia, unspecified: Secondary | ICD-10-CM

## 2015-09-30 DIAGNOSIS — Z8669 Personal history of other diseases of the nervous system and sense organs: Secondary | ICD-10-CM

## 2015-09-30 DIAGNOSIS — F32A Depression, unspecified: Secondary | ICD-10-CM

## 2015-09-30 LAB — POCT URINALYSIS DIPSTICK
Bilirubin, UA: NEGATIVE
GLUCOSE UA: NEGATIVE
Ketones, UA: NEGATIVE
LEUKOCYTES UA: NEGATIVE
NITRITE UA: NEGATIVE
Protein, UA: NEGATIVE
Spec Grav, UA: 1.015
UROBILINOGEN UA: 0.2
pH, UA: 7

## 2015-09-30 MED ORDER — ELETRIPTAN HYDROBROMIDE 40 MG PO TABS: 40.0000 mg | ORAL_TABLET | ORAL | Status: DC | PRN

## 2015-09-30 MED ORDER — SUMATRIPTAN SUCCINATE 100 MG PO TABS: 100.0000 mg | ORAL_TABLET | ORAL | Status: DC | PRN

## 2015-09-30 NOTE — Patient Instructions (Addendum)
Try Relpax at onset of migraine. Fioicet as rescue medication. Consider phototherapy for PLEVA. RTC 6 months and continue lipid medication and thyroid medication.

## 2015-10-22 ENCOUNTER — Telehealth: Payer: Self-pay | Admitting: Internal Medicine

## 2015-10-22 DIAGNOSIS — R21 Rash and other nonspecific skin eruption: Secondary | ICD-10-CM

## 2015-10-22 MED ORDER — MEBENDAZOLE 100 MG PO CHEW
100.0000 mg | CHEWABLE_TABLET | Freq: Once | ORAL | Status: DC
Start: 2015-10-22 — End: 2016-04-03

## 2015-10-22 NOTE — Telephone Encounter (Signed)
Mebendazole 100 mg tablet one time dose

## 2015-10-22 NOTE — Telephone Encounter (Signed)
Her daughter has just been diagnosed with penworm.  She's being treated.  Baxter HireKristen is going to treat her son as well.  She wants to know if you will give her some medication to treat her as well.  She's afraid NOT to treat everyone in the family.  She states her level of anxiety is at HIGH ALERT!  She is terrified of getting it and wants to be sure everyone is treated.  Please advise.    Pharmacy:  [facility]

## 2015-10-22 NOTE — Telephone Encounter (Signed)
Patient says her pediatrician gave her kids Dewaine Oatsalbenza, but insurance wont cover it.  Dr Lenord FellersBaxley said to have patient take mebendazole 100mg .  Rx sent to [facility].  Patient aware.

## 2015-10-30 ENCOUNTER — Encounter: Payer: Self-pay | Admitting: Internal Medicine

## 2015-10-30 DIAGNOSIS — F32A Depression, unspecified: Secondary | ICD-10-CM | POA: Insufficient documentation

## 2015-10-30 DIAGNOSIS — L410 Pityriasis lichenoides et varioliformis acuta: Secondary | ICD-10-CM | POA: Insufficient documentation

## 2015-10-30 DIAGNOSIS — F329 Major depressive disorder, single episode, unspecified: Secondary | ICD-10-CM | POA: Insufficient documentation

## 2015-10-30 NOTE — Progress Notes (Signed)
   Subjective:    Patient ID: Dawn RidgesKristen H Engebretson, female    DOB: 02/24/1986, 30 y.o.   MRN: [id number]  HPI 30 year old Female in today for health maintenance exam and evaluation of medical issues. Recently presented here with rash and was referred to dermatologist, Dr. Charlton HawsMcConnell. 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 She was treated with doxycycline initially. Has been advised to consider phototherapy such as tanning bed. Says dermatologist told her it might be a year going away. Diagnosis was made by biopsy.  Had flu vaccine October 2016.  Has frequent respiratory infections. History of hyperlipidemia, hypothyroidism and polycystic ovary syndrome. She is obese. She took Clomid to get pregnant the first time. First child was delivered February 2012 by necessary and section for failure to progress. Has been on thyroid replacement since 2010.  Social history: She is married. Has a four-year college degree. 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 Does not smoke or consume alcohol. 2 children.  GYN is [facility]. In March 2012 had Pap smear with ASCUS.  Family history: Mother with history of hypothyroidism. Father with history of non-Hodgkin's lymphoma and a remote history of prostate cancer. 2 brothers in good health. Both parents with hyperlipidemia.      Review of Systems skin rash which is annoying to her and unsightly. No new complaints.     Objective:   Physical Exam  Constitutional: She is oriented to person, place, and time. She appears well-developed and well-nourished. No distress.  HENT:  Head: Normocephalic and atraumatic.  Right Ear: External ear normal.  Left Ear: External ear normal.  Mouth/Throat: Oropharynx is clear and moist. No oropharyngeal exudate.  Eyes: Conjunctivae and EOM are normal. Pupils are equal, round, and reactive to light. Right eye exhibits no discharge.  Left eye exhibits no discharge.  Neck: Neck supple. No JVD present. No thyromegaly present.  Cardiovascular: Normal rate, regular rhythm, normal heart sounds and intact distal pulses.   No murmur heard. Pulmonary/Chest: Effort normal and breath sounds normal. No respiratory distress. She has no wheezes. She has no rales. She exhibits no tenderness.  Abdominal: Soft. Bowel sounds are normal. She exhibits no distension and no mass. There is no tenderness. There is no rebound and no guarding.  Genitourinary:  Deferred OB/GYN  Lymphadenopathy:    She has no cervical adenopathy.  Neurological: She is alert and oriented to person, place, and time. She has normal reflexes. No cranial nerve deficit. Coordination normal.  Skin: Skin is warm and dry. No rash noted. She is not diaphoretic.  Skin rash diagnosed as PLEVA affecting arms abdomen and legs  Psychiatric: She has a normal mood and affect. Her behavior is normal. Judgment and thought content normal.  Vitals reviewed.         Assessment & Plan:  PLEVA skin rash-followed by Dr. Charlton HawsMcConnell, dermatologist  Hyperlipidemia-treated with statin. Triglycerides elevated at 173  Obesity-encouraged diet and exercise  Hypothyroidism-stable on thyroid replacement  Depression-treated with Zoloft and stable  History polycystic ovary syndrome-followed by GYN  History of migraine headaches-trial of Relpax at onset of migraine and if no relief take Esgic.  Impaired glucose tolerance-hemoglobin A1c 5.8%. Needs to watch diet and exercise.  Plan: Continue same medications and return in 6 months. Consider phototherapy for skin rash.

## 2015-11-25 ENCOUNTER — Other Ambulatory Visit: Payer: Self-pay

## 2015-11-25 MED ORDER — LEVOTHYROXINE SODIUM 100 MCG PO TABS: 100.0000 ug | ORAL_TABLET | Freq: Every day | ORAL | Status: DC

## 2015-12-07 DIAGNOSIS — L410 Pityriasis lichenoides et varioliformis acuta: Secondary | ICD-10-CM | POA: Diagnosis not present

## 2015-12-17 ENCOUNTER — Other Ambulatory Visit: Payer: Self-pay | Admitting: Internal Medicine

## 2016-02-02 ENCOUNTER — Other Ambulatory Visit: Payer: Self-pay | Admitting: Internal Medicine

## 2016-02-02 NOTE — Telephone Encounter (Signed)
Refill #30 with no refills

## 2016-02-03 NOTE — Telephone Encounter (Signed)
Phoned to pharmacy 

## 2016-03-27 ENCOUNTER — Other Ambulatory Visit: Payer: Self-pay | Admitting: Internal Medicine

## 2016-03-28 ENCOUNTER — Other Ambulatory Visit: Payer: BLUE CROSS/BLUE SHIELD | Admitting: Internal Medicine

## 2016-03-28 DIAGNOSIS — E669 Obesity, unspecified: Secondary | ICD-10-CM

## 2016-03-28 DIAGNOSIS — F329 Major depressive disorder, single episode, unspecified: Secondary | ICD-10-CM

## 2016-03-28 DIAGNOSIS — E039 Hypothyroidism, unspecified: Secondary | ICD-10-CM | POA: Diagnosis not present

## 2016-03-28 DIAGNOSIS — F32A Depression, unspecified: Secondary | ICD-10-CM

## 2016-03-28 DIAGNOSIS — E785 Hyperlipidemia, unspecified: Secondary | ICD-10-CM | POA: Diagnosis not present

## 2016-03-28 LAB — HEPATIC FUNCTION PANEL
ALBUMIN: 3.6 g/dL (ref 3.6–5.1)
ALK PHOS: 42 U/L (ref 33–115)
ALT: 12 U/L (ref 6–29)
AST: 13 U/L (ref 10–30)
BILIRUBIN TOTAL: 0.4 mg/dL (ref 0.2–1.2)
Bilirubin, Direct: 0.1 mg/dL (ref <=0.2)
Indirect Bilirubin: 0.3 mg/dL (ref 0.2–1.2)
TOTAL PROTEIN: 6.1 g/dL (ref 6.1–8.1)

## 2016-03-28 LAB — LIPID PANEL
CHOLESTEROL: 155 mg/dL (ref 125–200)
HDL: 48 mg/dL (ref >=46)
LDL Cholesterol: 62 mg/dL (ref <130)
Total CHOL/HDL Ratio: 3.2 Ratio (ref <=5.0)
Triglycerides: 225 mg/dL — ABNORMAL HIGH (ref <150)
VLDL: 45 mg/dL — ABNORMAL HIGH (ref <30)

## 2016-03-29 LAB — HEMOGLOBIN A1C
HEMOGLOBIN A1C: 5.5 % (ref <5.7)
Mean Plasma Glucose: 111 mg/dL

## 2016-03-29 LAB — TSH: TSH: 1.13 mIU/L

## 2016-03-31 ENCOUNTER — Ambulatory Visit: Payer: BLUE CROSS/BLUE SHIELD | Admitting: Internal Medicine

## 2016-04-03 ENCOUNTER — Encounter: Payer: Self-pay | Admitting: Internal Medicine

## 2016-04-03 ENCOUNTER — Ambulatory Visit (INDEPENDENT_AMBULATORY_CARE_PROVIDER_SITE_OTHER): Payer: BLUE CROSS/BLUE SHIELD | Admitting: Internal Medicine

## 2016-04-03 VITALS — BP 122/74 | HR 100 | Temp 97.9°F | Ht 67.0 in | Wt 285.0 lb

## 2016-04-03 DIAGNOSIS — E785 Hyperlipidemia, unspecified: Secondary | ICD-10-CM

## 2016-04-03 DIAGNOSIS — Z23 Encounter for immunization: Secondary | ICD-10-CM | POA: Diagnosis not present

## 2016-04-03 DIAGNOSIS — E039 Hypothyroidism, unspecified: Secondary | ICD-10-CM

## 2016-04-03 DIAGNOSIS — G4700 Insomnia, unspecified: Secondary | ICD-10-CM | POA: Diagnosis not present

## 2016-04-03 MED ORDER — SERTRALINE HCL 50 MG PO TABS: 50.0000 mg | ORAL_TABLET | Freq: Every day | ORAL | 3 refills | Status: DC

## 2016-04-03 MED ORDER — ATORVASTATIN CALCIUM 20 MG PO TABS: 20.0000 mg | ORAL_TABLET | Freq: Every day | ORAL | 1 refills | Status: DC

## 2016-04-03 MED ORDER — LEVOTHYROXINE SODIUM 100 MCG PO TABS: 100.0000 ug | ORAL_TABLET | Freq: Every day | ORAL | 5 refills | Status: DC

## 2016-04-03 MED ORDER — ALPRAZOLAM 0.5 MG PO TABS: 0.5000 mg | ORAL_TABLET | Freq: Every evening | ORAL | 1 refills | Status: DC | PRN

## 2016-04-03 NOTE — Progress Notes (Signed)
   Subjective:    Patient ID: Dawn Navarro, female    DOB: 09/24/1985, 30 y.o.   MRN: [id number]  HPI She's in today for follow-up on hyperlipidemia and hypothyroidism. Says that she ate some doughnuts recently and that is why her triglycerides are elevated. Denies noncompliance with statin medication. She's been having issues with insomnia regarding some developmental delay in her daughter. Daughter is entering kindergarten and has not been formally tested. Apparently has some issues recognizing ABCs.. Explained to her was not comfortable starting chronic insomnia medication. We can give her something temporarily. She needs to talk with daughter's pediatrician about having daughter formally tested for learning disabilities.  She is asking about refilling oral contraceptives. Explained to her that although GYN told her she'll he needed Pap every 3 years she needed to have annual GYN exam and refill of her oral contraceptives through that physician.  Remains on doxycycline for pityriasis lichenoides et varioliformis acuta.  TSH is normal. Triglycerides are 225 and previously were 173 and February and 210 in August 2016. Lowest triglycerides have been in the last several years was 156 and 2015. Liver functions are normal.  Hemoglobin A1c a year ago was 6% and is now 5.5%.     Review of Systems see above     Objective:   Physical Exam Skin warm and dry. Nodes none. No thyromegaly. Chest clear. Cardiac exam regular rate and rhythm normal S1 and S2. Extremities without edema       Assessment & Plan:  Hypothyroidism  Obesity  Impaired glucose tolerance-now has normal hemoglobin A1c  Insomnia  PLEVA  Hyperlipidemia-elevated triglycerides. Needs to work harder with diet and exercise  Plan: Given small quantity of Xanax to take 0.5 mg at bedtime temporarily. May need counseling regarding anxiety over daughter's developmental disability. History turn in 3 months for repeat fasting  lipid panel without office visit. Is to return in 6 months for physical exam. Continue doxycycline treatment for skin disorder with Dr. Charlton HawsMcConnell. Continue same dose of thyroid medication. See GYN to have oral contraceptives refilled.

## 2016-04-03 NOTE — Patient Instructions (Signed)
Return in 3 months to have fasting lipid panel repeated without office visit. Book physical exam in 6 months. Please try harder with regard to diet exercise and weight loss. Flu vaccine given. Continue same dose of thyroid replacement. Small quantity of Xanax prescribed for insomnia.

## 2016-04-20 ENCOUNTER — Other Ambulatory Visit: Payer: Self-pay | Admitting: Obstetrics & Gynecology

## 2016-04-20 DIAGNOSIS — Z01419 Encounter for gynecological examination (general) (routine) without abnormal findings: Secondary | ICD-10-CM | POA: Diagnosis not present

## 2016-04-20 DIAGNOSIS — Z6841 Body Mass Index (BMI) 40.0 and over, adult: Secondary | ICD-10-CM | POA: Diagnosis not present

## 2016-04-20 DIAGNOSIS — Z124 Encounter for screening for malignant neoplasm of cervix: Secondary | ICD-10-CM | POA: Diagnosis not present

## 2016-04-21 LAB — CYTOLOGY - PAP

## 2016-05-04 DIAGNOSIS — Z6841 Body Mass Index (BMI) 40.0 and over, adult: Secondary | ICD-10-CM | POA: Diagnosis not present

## 2016-05-04 DIAGNOSIS — Z30430 Encounter for insertion of intrauterine contraceptive device: Secondary | ICD-10-CM | POA: Diagnosis not present

## 2016-05-19 ENCOUNTER — Other Ambulatory Visit: Payer: Self-pay | Admitting: Internal Medicine

## 2016-05-19 NOTE — Telephone Encounter (Signed)
Call in #30 with one refill please-- cannot be e-scribed

## 2016-05-22 DIAGNOSIS — S239XXA Sprain of unspecified parts of thorax, initial encounter: Secondary | ICD-10-CM | POA: Diagnosis not present

## 2016-05-22 DIAGNOSIS — M5430 Sciatica, unspecified side: Secondary | ICD-10-CM | POA: Diagnosis not present

## 2016-05-29 ENCOUNTER — Encounter: Payer: Self-pay | Admitting: Internal Medicine

## 2016-05-29 ENCOUNTER — Ambulatory Visit (INDEPENDENT_AMBULATORY_CARE_PROVIDER_SITE_OTHER): Payer: BLUE CROSS/BLUE SHIELD | Admitting: Internal Medicine

## 2016-05-29 VITALS — BP 136/86 | HR 110 | Temp 98.3°F | Wt 289.0 lb

## 2016-05-29 DIAGNOSIS — R350 Frequency of micturition: Secondary | ICD-10-CM

## 2016-05-29 DIAGNOSIS — M545 Low back pain: Secondary | ICD-10-CM | POA: Diagnosis not present

## 2016-05-29 LAB — POCT URINALYSIS DIPSTICK
BILIRUBIN UA: NEGATIVE
GLUCOSE UA: NEGATIVE
Ketones, UA: NEGATIVE
LEUKOCYTES UA: NEGATIVE
NITRITE UA: NEGATIVE
Protein, UA: NEGATIVE
Spec Grav, UA: <=1.005
UROBILINOGEN UA: NEGATIVE
pH, UA: 7.5

## 2016-05-29 MED ORDER — HYDROCODONE-ACETAMINOPHEN 10-325 MG PO TABS: ORAL_TABLET | ORAL | 0 refills | Status: DC

## 2016-05-29 MED ORDER — MELOXICAM 15 MG PO TABS: 15.0000 mg | ORAL_TABLET | Freq: Every day | ORAL | 0 refills | Status: DC

## 2016-05-29 MED ORDER — CIPROFLOXACIN HCL 500 MG PO TABS: 500.0000 mg | ORAL_TABLET | Freq: Two times a day (BID) | ORAL | 0 refills | Status: DC

## 2016-05-29 NOTE — Progress Notes (Signed)
   Subjective:    Patient ID: Dawn Navarro, female    DOB: 09/09/1985, 30 y.o.   MRN: [id number]  HPI One week ago, treated with steroids and Flexeril for back pain at [facility] in [place]. Ibuprofen has been better than steroids. Stopped steroids a few days ago. Today has had urinary  frequency, urinating every hour. No blood in urine. Has bilateral back pain. Denies heavy lifting other than 41 pound child. Back pain is sometimes midline and sometimes on the sides bilaterally. No weakness or numbness in legs.  Has new IUD  Hemoglobin A1c in August was 5.5%. No glucose on urine dipstick      Review of Systems no fever chill nausea or vomiting.     Objective:   Physical Exam  Straight leg raising is negative at 90 degrees. Dipstick U/A  Is negative.      Assessment & Plan:  Low back pain  Urinary frequency- etiology unclear  Plan: Cipro 500 mg twice daily for 3 days. Meloxicam 15 mg daily with food. Hydrocodone/APAP 10/325 #30 one by mouth daily at bedtime when necessary back pain at bedtime. Call if not better in one week or sooner if worse.

## 2016-05-29 NOTE — Patient Instructions (Addendum)
Cipro 500 mg bid x 3 days. Meloxicam 15 mg daily. Norco 10/ 325 one po q hs prn pain. Call if not better in one week or sooner if worse.

## 2016-07-19 DIAGNOSIS — L410 Pityriasis lichenoides et varioliformis acuta: Secondary | ICD-10-CM | POA: Diagnosis not present

## 2016-07-20 ENCOUNTER — Other Ambulatory Visit: Payer: BLUE CROSS/BLUE SHIELD | Admitting: Internal Medicine

## 2016-07-21 ENCOUNTER — Other Ambulatory Visit: Payer: BLUE CROSS/BLUE SHIELD | Admitting: Internal Medicine

## 2016-08-02 NOTE — Telephone Encounter (Signed)
Called in to [facility] - [place] - [address]Phone: [phone]

## 2016-08-15 ENCOUNTER — Other Ambulatory Visit: Payer: BLUE CROSS/BLUE SHIELD | Admitting: Internal Medicine

## 2016-08-15 DIAGNOSIS — E785 Hyperlipidemia, unspecified: Secondary | ICD-10-CM | POA: Diagnosis not present

## 2016-08-15 LAB — LIPID PANEL
Cholesterol: 152 mg/dL (ref <200)
HDL: 35 mg/dL — AB (ref >50)
LDL Cholesterol: 86 mg/dL (ref <100)
TRIGLYCERIDES: 157 mg/dL — AB (ref <150)
Total CHOL/HDL Ratio: 4.3 Ratio (ref <5.0)
VLDL: 31 mg/dL — ABNORMAL HIGH (ref <30)

## 2016-08-28 ENCOUNTER — Other Ambulatory Visit: Payer: Self-pay | Admitting: Internal Medicine

## 2016-09-01 ENCOUNTER — Other Ambulatory Visit: Payer: Self-pay | Admitting: Internal Medicine

## 2016-09-17 DIAGNOSIS — B349 Viral infection, unspecified: Secondary | ICD-10-CM | POA: Diagnosis not present

## 2016-09-17 DIAGNOSIS — Z6841 Body Mass Index (BMI) 40.0 and over, adult: Secondary | ICD-10-CM | POA: Diagnosis not present

## 2016-09-17 DIAGNOSIS — K529 Noninfective gastroenteritis and colitis, unspecified: Secondary | ICD-10-CM | POA: Diagnosis not present

## 2016-09-25 ENCOUNTER — Encounter: Payer: Self-pay | Admitting: Internal Medicine

## 2016-09-25 ENCOUNTER — Ambulatory Visit (INDEPENDENT_AMBULATORY_CARE_PROVIDER_SITE_OTHER): Payer: BLUE CROSS/BLUE SHIELD | Admitting: Internal Medicine

## 2016-09-25 VITALS — BP 138/86 | HR 90 | Temp 98.7°F | Ht 67.0 in | Wt 294.0 lb

## 2016-09-25 DIAGNOSIS — J9801 Acute bronchospasm: Secondary | ICD-10-CM | POA: Diagnosis not present

## 2016-09-25 DIAGNOSIS — J22 Unspecified acute lower respiratory infection: Secondary | ICD-10-CM

## 2016-09-25 DIAGNOSIS — H6693 Otitis media, unspecified, bilateral: Secondary | ICD-10-CM | POA: Diagnosis not present

## 2016-09-25 MED ORDER — HYDROCODONE-HOMATROPINE 5-1.5 MG/5ML PO SYRP: 5.0000 mL | ORAL_SOLUTION | Freq: Three times a day (TID) | ORAL | 0 refills | Status: DC | PRN

## 2016-09-25 MED ORDER — ALBUTEROL SULFATE HFA 108 (90 BASE) MCG/ACT IN AERS: 2.0000 | INHALATION_SPRAY | Freq: Four times a day (QID) | RESPIRATORY_TRACT | 2 refills | Status: DC | PRN

## 2016-09-25 MED ORDER — CLARITHROMYCIN 500 MG PO TABS: 500.0000 mg | ORAL_TABLET | Freq: Two times a day (BID) | ORAL | 0 refills | Status: DC

## 2016-09-25 NOTE — Patient Instructions (Addendum)
Biaxin 500 mg twice daily for 10 days. Albuterol inhaler 2 sprays by mouth 4 times daily. Hycodan 1 teaspoon by mouth every 8 hours when necessary cough. Diflucan if Candida vaginitis symptoms developed while on antibiotics. Rest and drink plenty of fluids. Do not take statin medication while on Biaxin.

## 2016-09-25 NOTE — Progress Notes (Signed)
   Subjective:    Patient ID: Dawn Navarro, female    DOB: 10/22/1985, 31 y.o.   MRN: [id number]  HPI  31 year old Female with gastroentertitis with negative flu test treated with Zofran at urgent care center about 8 days ago. Child had similar illness  Now with URI symtoms and coughing. No shaking chills myalgias or significant headache. Mainly cough and congestion.    Review of Systems see above     Objective:   Physical Exam  Constitutional: She appears well-developed and well-nourished. No distress.  HENT:  Head: Normocephalic and atraumatic.  Both TMs dull and red  Eyes: Conjunctivae and EOM are normal. Pupils are equal, round, and reactive to light. Right eye exhibits no discharge. Left eye exhibits no discharge.  Neck: Neck supple. No JVD present. No thyromegaly present.  Cardiovascular: Normal rate and regular rhythm.   Pulmonary/Chest: Effort normal and breath sounds normal. No respiratory distress. She has no wheezes. She has no rales.  Lymphadenopathy:    She has no cervical adenopathy.  Skin: She is not diaphoretic.          Assessment & Plan:  BOM Bronchitis Bronchospasm   Plan: Biaxin 500 mg twice daily for 10 days. Hycodan 1 teaspoon by mouth every 8 hours when necessary cough. Has a refill already on Diflucan should she develop Candida vaginitis while on antibiotics. Rest and drink plenty of fluids. Albuterol inhaler 2 sprays by mouth 4 times daily.

## 2016-09-26 ENCOUNTER — Telehealth: Payer: Self-pay

## 2016-09-26 NOTE — Telephone Encounter (Signed)
No . This is a controlled substance and we cannot change at this point. . She can take Delsym with this. Use inhalers.

## 2016-09-26 NOTE — Telephone Encounter (Signed)
Patient was notified of Dr. Baxley's instructions.  

## 2016-09-26 NOTE — Telephone Encounter (Signed)
Patient states she took HYCODAN last night and she was still coughing a lot, she said she remembers that you mentioned there were two different cough syrups and she would like to know if she can get a prescription for TUSSIONEX.

## 2016-10-03 ENCOUNTER — Other Ambulatory Visit: Payer: BLUE CROSS/BLUE SHIELD | Admitting: Internal Medicine

## 2016-10-05 ENCOUNTER — Encounter: Payer: BLUE CROSS/BLUE SHIELD | Admitting: Internal Medicine

## 2016-10-12 ENCOUNTER — Ambulatory Visit
Admission: RE | Admit: 2016-10-12 | Discharge: 2016-10-12 | Disposition: A | Discharge status: Home / Self Care | Payer: BLUE CROSS/BLUE SHIELD | Source: Ambulatory Visit | Attending: Internal Medicine | Admitting: Internal Medicine

## 2016-10-12 ENCOUNTER — Ambulatory Visit (INDEPENDENT_AMBULATORY_CARE_PROVIDER_SITE_OTHER): Payer: BLUE CROSS/BLUE SHIELD | Admitting: Internal Medicine

## 2016-10-12 VITALS — BP 130/80 | HR 90 | Temp 98.6°F

## 2016-10-12 DIAGNOSIS — R059 Cough, unspecified: Secondary | ICD-10-CM

## 2016-10-12 DIAGNOSIS — J22 Unspecified acute lower respiratory infection: Secondary | ICD-10-CM | POA: Diagnosis not present

## 2016-10-12 DIAGNOSIS — R05 Cough: Secondary | ICD-10-CM

## 2016-10-12 DIAGNOSIS — H6503 Acute serous otitis media, bilateral: Secondary | ICD-10-CM

## 2016-10-12 MED ORDER — PREDNISONE 10 MG PO TABS: ORAL_TABLET | ORAL | 0 refills | Status: DC

## 2016-10-12 MED ORDER — LEVOFLOXACIN 500 MG PO TABS: 500.0000 mg | ORAL_TABLET | Freq: Every day | ORAL | 0 refills | Status: DC

## 2016-10-12 MED ORDER — HYDROCOD POLST-CPM POLST ER 10-8 MG/5ML PO SUER: 5.0000 mL | Freq: Two times a day (BID) | ORAL | 0 refills | Status: DC | PRN

## 2016-11-01 ENCOUNTER — Other Ambulatory Visit: Payer: Self-pay | Admitting: Internal Medicine

## 2016-11-01 ENCOUNTER — Encounter: Payer: Self-pay | Admitting: Internal Medicine

## 2016-11-01 NOTE — Progress Notes (Signed)
   Subjective:    Patient ID: Dawn Navarro, female    DOB: 05/28/1986, 31 y.o.   MRN: [id number]  HPI 31 year old Female has come down with another respiratory infection. Has cough, sore throat, headache and ear fullness. Was here February 19 with similar illness along with bronchospasm. At that time was treated with Biaxin and Hycodan. Was given an albuterol inhaler.    Review of Systems see above     Objective:   Physical Exam Skin warm and dry. Pharynx slightly injected. TMs are full and pink bilaterally. Neck supple without significant adenopathy. Chest clear to auscultation.       Assessment & Plan:  Acute bilateral serous otitis media  Acute lower respiratory infection  Plan: Sterapred DS 10 mg six-day Dosepak. Tussionex 1 teaspoon by mouth every 12 hours when necessary cough. Albuterol inhaler 2 sprays by mouth 4 times daily. Levaquin 500 milligrams daily for 10 days.

## 2016-11-01 NOTE — Patient Instructions (Signed)
Tussionex 1 teaspoon  by mouth every 12 hours when necessary cough. Albuterol inhaler 2 sprays 4 times daily. Sterapred DS 10 mg 12 day dosepak. Levaquin 500 milligrams daily for 10 days. Diflucan for Candida vaginitis if develops while on antibiotic and prednisone therapy.

## 2016-11-23 ENCOUNTER — Other Ambulatory Visit: Payer: Self-pay | Admitting: Internal Medicine

## 2016-12-05 ENCOUNTER — Other Ambulatory Visit: Payer: BLUE CROSS/BLUE SHIELD | Admitting: Internal Medicine

## 2016-12-05 DIAGNOSIS — Z0000 Encounter for general adult medical examination without abnormal findings: Secondary | ICD-10-CM

## 2016-12-05 DIAGNOSIS — R7302 Impaired glucose tolerance (oral): Secondary | ICD-10-CM

## 2016-12-05 DIAGNOSIS — E785 Hyperlipidemia, unspecified: Secondary | ICD-10-CM | POA: Diagnosis not present

## 2016-12-05 DIAGNOSIS — Z1321 Encounter for screening for nutritional disorder: Secondary | ICD-10-CM

## 2016-12-05 DIAGNOSIS — E039 Hypothyroidism, unspecified: Secondary | ICD-10-CM | POA: Diagnosis not present

## 2016-12-05 LAB — CMP 10231
AG Ratio: 1.5 Ratio (ref 1.0–2.5)
ALT: 15 U/L (ref 6–29)
AST: 17 U/L (ref 10–30)
Albumin: 3.9 g/dL (ref 3.6–5.1)
Alkaline Phosphatase: 70 U/L (ref 33–115)
BUN/Creatinine Ratio: 8.5 Ratio (ref 6–22)
BUN: 6 mg/dL — ABNORMAL LOW (ref 7–25)
CALCIUM: 9 mg/dL (ref 8.6–10.2)
CHLORIDE: 105 mmol/L (ref 98–110)
CO2: 25 mmol/L (ref 20–31)
CREATININE: 0.71 mg/dL (ref 0.50–1.10)
Globulin: 2.6 g/dL (ref 1.9–3.7)
Glucose, Bld: 87 mg/dL (ref 65–99)
Potassium: 4.5 mmol/L (ref 3.5–5.3)
SODIUM: 140 mmol/L (ref 135–146)
Total Bilirubin: 0.4 mg/dL (ref 0.2–1.2)
Total Protein: 6.5 g/dL (ref 6.1–8.1)

## 2016-12-05 LAB — LIPID PANEL
CHOLESTEROL: 148 mg/dL (ref <200)
HDL: 37 mg/dL — ABNORMAL LOW (ref >50)
LDL CALC: 86 mg/dL (ref <100)
TRIGLYCERIDES: 126 mg/dL (ref <150)
Total CHOL/HDL Ratio: 4 Ratio (ref <5.0)
VLDL: 25 mg/dL (ref <30)

## 2016-12-05 LAB — CBC WITH DIFFERENTIAL/PLATELET
Basophils Absolute: 0 cells/uL (ref 0–200)
Basophils Relative: 0 %
EOS PCT: 2 %
Eosinophils Absolute: 156 cells/uL (ref 15–500)
HEMATOCRIT: 38.5 % (ref 35.0–45.0)
Hemoglobin: 12.3 g/dL (ref 11.7–15.5)
LYMPHS PCT: 31 %
Lymphs Abs: 2418 cells/uL (ref 850–3900)
MCH: 28 pg (ref 27.0–33.0)
MCHC: 31.9 g/dL — AB (ref 32.0–36.0)
MCV: 87.5 fL (ref 80.0–100.0)
MPV: 10.3 fL (ref 7.5–12.5)
Monocytes Absolute: 546 cells/uL (ref 200–950)
Monocytes Relative: 7 %
NEUTROS PCT: 60 %
Neutro Abs: 4680 cells/uL (ref 1500–7800)
PLATELETS: 363 10*3/uL (ref 140–400)
RBC: 4.4 MIL/uL (ref 3.80–5.10)
RDW: 15.6 % — AB (ref 11.0–15.0)
WBC: 7.8 10*3/uL (ref 3.8–10.8)

## 2016-12-05 LAB — TSH: TSH: 0.73 mIU/L

## 2016-12-06 LAB — MICROALBUMIN / CREATININE URINE RATIO
Creatinine, Urine: 137 mg/dL (ref 20–320)
Microalb Creat Ratio: 7 mcg/mg creat (ref <30)
Microalb, Ur: 0.9 mg/dL

## 2016-12-06 LAB — HEMOGLOBIN A1C
HEMOGLOBIN A1C: 5.6 % (ref <5.7)
MEAN PLASMA GLUCOSE: 114 mg/dL

## 2016-12-06 LAB — VITAMIN D 25 HYDROXY (VIT D DEFICIENCY, FRACTURES): Vit D, 25-Hydroxy: 26 ng/mL — ABNORMAL LOW (ref 30–100)

## 2016-12-07 ENCOUNTER — Encounter: Payer: Self-pay | Admitting: Internal Medicine

## 2016-12-07 ENCOUNTER — Ambulatory Visit (INDEPENDENT_AMBULATORY_CARE_PROVIDER_SITE_OTHER): Payer: BLUE CROSS/BLUE SHIELD | Admitting: Internal Medicine

## 2016-12-07 VITALS — BP 132/78 | HR 76 | Temp 98.3°F | Ht 67.0 in | Wt 273.0 lb

## 2016-12-07 DIAGNOSIS — Z0000 Encounter for general adult medical examination without abnormal findings: Secondary | ICD-10-CM | POA: Diagnosis not present

## 2016-12-07 DIAGNOSIS — E784 Other hyperlipidemia: Secondary | ICD-10-CM | POA: Diagnosis not present

## 2016-12-07 DIAGNOSIS — J309 Allergic rhinitis, unspecified: Secondary | ICD-10-CM

## 2016-12-07 DIAGNOSIS — R05 Cough: Secondary | ICD-10-CM | POA: Diagnosis not present

## 2016-12-07 DIAGNOSIS — F3289 Other specified depressive episodes: Secondary | ICD-10-CM

## 2016-12-07 DIAGNOSIS — Z6841 Body Mass Index (BMI) 40.0 and over, adult: Secondary | ICD-10-CM

## 2016-12-07 DIAGNOSIS — R059 Cough, unspecified: Secondary | ICD-10-CM

## 2016-12-07 DIAGNOSIS — E7849 Other hyperlipidemia: Secondary | ICD-10-CM

## 2016-12-07 DIAGNOSIS — H6503 Acute serous otitis media, bilateral: Secondary | ICD-10-CM

## 2016-12-07 DIAGNOSIS — J069 Acute upper respiratory infection, unspecified: Secondary | ICD-10-CM

## 2016-12-07 DIAGNOSIS — R7302 Impaired glucose tolerance (oral): Secondary | ICD-10-CM

## 2016-12-07 DIAGNOSIS — H6502 Acute serous otitis media, left ear: Secondary | ICD-10-CM

## 2016-12-07 DIAGNOSIS — E282 Polycystic ovarian syndrome: Secondary | ICD-10-CM

## 2016-12-07 DIAGNOSIS — L410 Pityriasis lichenoides et varioliformis acuta: Secondary | ICD-10-CM

## 2016-12-07 DIAGNOSIS — Z8669 Personal history of other diseases of the nervous system and sense organs: Secondary | ICD-10-CM

## 2016-12-07 MED ORDER — ALBUTEROL SULFATE HFA 108 (90 BASE) MCG/ACT IN AERS: 2.0000 | INHALATION_SPRAY | Freq: Four times a day (QID) | RESPIRATORY_TRACT | 2 refills | Status: DC | PRN

## 2016-12-07 MED ORDER — HYDROCOD POLST-CPM POLST ER 10-8 MG/5ML PO SUER: 5.0000 mL | Freq: Two times a day (BID) | ORAL | 0 refills | Status: DC | PRN

## 2016-12-07 MED ORDER — CLARITHROMYCIN 500 MG PO TABS: 500.0000 mg | ORAL_TABLET | Freq: Two times a day (BID) | ORAL | 0 refills | Status: DC

## 2016-12-07 MED ORDER — METHYLPREDNISOLONE ACETATE 80 MG/ML IJ SUSP
80.0000 mg | Freq: Once | INTRAMUSCULAR | Status: AC
  Administered 2016-12-07: 80 mg via INTRAMUSCULAR

## 2016-12-07 MED ORDER — FLUCONAZOLE 150 MG PO TABS: 150.0000 mg | ORAL_TABLET | Freq: Once | ORAL | 2 refills | Status: AC

## 2016-12-07 NOTE — Progress Notes (Signed)
Subjective:    Patient ID: Dawn Navarro, female    DOB: 1986/02/07, 31 y.o.   MRN: [id number]  HPI 31 year old Female in today for health maintenance exam and evaluation of medical issues. She has a history of hyperlipidemia and obesity.  She has a history of depression which is stable at this point in time. 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 Thinks it's better now that the children are bit older.  History of allergic rhinitis and recurrent ear infections. History of hypothyroidism. History of polycystic ovary syndrome.  Was diagnosed with PLEVA a self-limited inflammatory skin condition. She has been treated with doxycycline and has been advised to consider phototherapy such as tanning bed which she has not done. Dermatologist told her it might be a year going away and it has been a year. Diagnosis was made by biopsy. She is frustrated with this and thinks she may want to go see another dermatologist.  History of frequent respiratory infections. She took Clomid to get pregnant the first time. First child was delivered February 2012 by C-section for failure to progress.  Has been on thyroid replacement since 2010.  Social history: She is married. Has a four-year college degree. 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 2 children.  [facility] is her GYN physicians.  In March 2012 she had Pap smear with ASCUS.  Family history: Mother with history of hypothyroidism. Father with history of non-Hodgkin's lymphoma and a remote history of prostate cancer. 2 brothers in good health. Both parents with hyperlipidemia.   Review of Systems aside from skin rash 90 complaints     Objective:   Physical Exam  Constitutional: She is oriented to person, place, and time. She appears well-developed and well-nourished. No distress.  HENT:  Head: Normocephalic and atraumatic.  Mouth/Throat: Oropharynx is clear and moist. No oropharyngeal exudate.   TMs are full bilaterally  Eyes: Conjunctivae and EOM are normal. Pupils are equal, round, and reactive to light. Right eye exhibits no discharge. Left eye exhibits no discharge.  Neck: Neck supple. No JVD present. No thyromegaly present.  Cardiovascular: Normal rate, regular rhythm, normal heart sounds and intact distal pulses.   No murmur heard. Pulmonary/Chest: Effort normal and breath sounds normal. She has no wheezes.  Breasts normal female  Abdominal: Soft. Bowel sounds are normal. She exhibits no distension and no mass. There is no tenderness. There is no rebound and no guarding.  Genitourinary:  Genitourinary Comments: Deferred to OB/GYN  Musculoskeletal: She exhibits no edema.  Lymphadenopathy:    She has no cervical adenopathy.  Neurological: She is alert and oriented to person, place, and time. She has normal reflexes. No cranial nerve deficit. Coordination normal.  Skin: Skin is warm and dry. Rash noted. She is not diaphoretic.  Macular papular rash consistent with PLEVA  Psychiatric: She has a normal mood and affect. Her behavior is normal. Judgment and thought content normal.  Vitals reviewed.         Assessment & Plan:  BMI 42.76  Encouraged diet exercise and weight loss  Hypothyroidism-TSH is stable  Hyperlipidemia  PLEVA  Vitamin D deficiency-level is low at 26. Recommend 2000 units vitamin D 3 daily  Polycystic ovary syndrome  History of impaired glucose tolerance-has not wanted to be on metformin-hemoglobin A1c stable at 5.6%  History of migraine headaches treated with Fioricet. She uses this judiciously and has no issues with overuse.  History of recurrent ear infections  and respiratory infections-has one today which was treated with Biaxin 500 mg twice daily for 10 days and Tussionex.  History of depression this started as postpartum depression stable with Zoloft  Plan: Encouraged diet exercise and weight loss. May want to see another dermatologist  for another opinion regarding PLEVA

## 2017-01-01 NOTE — Patient Instructions (Addendum)
Take Biaxin as prescribed. Tussionex as needed sparingly for cough. Patient was to consider another dermatology opinion. Take migraine medication sparingly. Continue Zoloft. Please consider diet exercise and weight loss seriously. Consider metformin for polycystic ovary syndrome and impaired glucose tolerance. Return in 6 months or as needed.

## 2017-01-03 ENCOUNTER — Other Ambulatory Visit: Payer: Self-pay | Admitting: Internal Medicine

## 2017-02-06 ENCOUNTER — Ambulatory Visit (INDEPENDENT_AMBULATORY_CARE_PROVIDER_SITE_OTHER): Payer: BLUE CROSS/BLUE SHIELD | Admitting: Internal Medicine

## 2017-02-06 ENCOUNTER — Encounter: Payer: Self-pay | Admitting: Internal Medicine

## 2017-02-06 VITALS — BP 110/70 | HR 83 | Temp 97.7°F | Wt 293.0 lb

## 2017-02-06 DIAGNOSIS — J069 Acute upper respiratory infection, unspecified: Secondary | ICD-10-CM

## 2017-02-06 MED ORDER — HYDROCOD POLST-CPM POLST ER 10-8 MG/5ML PO SUER: 5.0000 mL | Freq: Two times a day (BID) | ORAL | 0 refills | Status: DC | PRN

## 2017-02-06 MED ORDER — FLUCONAZOLE 150 MG PO TABS: 150.0000 mg | ORAL_TABLET | Freq: Once | ORAL | 2 refills | Status: AC

## 2017-02-06 MED ORDER — LEVOFLOXACIN 500 MG PO TABS: 500.0000 mg | ORAL_TABLET | Freq: Every day | ORAL | 0 refills | Status: DC

## 2017-02-06 MED ORDER — PREDNISONE 10 MG PO TABS: ORAL_TABLET | ORAL | 0 refills | Status: DC

## 2017-02-06 NOTE — Progress Notes (Signed)
   Subjective:    Patient ID: Dawn Navarro, female    DOB: 06/28/1986, 31 y.o.   MRN: [id number]  HPI  31 year old Female with another respiratory infection. Has cough and congestion and slight wheezing. Has malaise and fatigue.    Review of Systems     Objective:   Physical Exam Skin warm and dry. Nodes none. TMs clear. Pharynx is clear. Neck is supple. Chest clear to auscultation. She sounds nasally congested.       Assessment & Plan:  Acute URI  Plan: Generally has an element of bronchospasm with acute respiratory infections. Take prednisone in tapering course as directed going from 60 mg to 0 mg over 7 days. Levaquin 500 milligrams daily for 10 days. Diflucan if needed for Candida vaginitis follow on antibiotics. Tussionex 1 teaspoon by mouth every 12 hours when necessary cough.

## 2017-02-11 DIAGNOSIS — S60459A Superficial foreign body of unspecified finger, initial encounter: Secondary | ICD-10-CM | POA: Diagnosis not present

## 2017-03-03 NOTE — Patient Instructions (Signed)
Take prednisone in tapering course as directed. Tussionex 1 teaspoon by mouth every 12 hours when necessary cough. Levaquin 500 milligrams daily for 10 days. Take Diflucan of Candida vaginitis develops while on antibiotics. Rest and drink plenty of fluids.

## 2017-03-25 DIAGNOSIS — M5430 Sciatica, unspecified side: Secondary | ICD-10-CM | POA: Diagnosis not present

## 2017-03-25 DIAGNOSIS — J209 Acute bronchitis, unspecified: Secondary | ICD-10-CM | POA: Diagnosis not present

## 2017-04-06 ENCOUNTER — Ambulatory Visit (INDEPENDENT_AMBULATORY_CARE_PROVIDER_SITE_OTHER): Payer: BLUE CROSS/BLUE SHIELD | Admitting: Internal Medicine

## 2017-04-06 ENCOUNTER — Encounter: Payer: Self-pay | Admitting: Internal Medicine

## 2017-04-06 VITALS — BP 108/60 | HR 110 | Temp 98.7°F | Resp 24 | Wt 283.0 lb

## 2017-04-06 DIAGNOSIS — J22 Unspecified acute lower respiratory infection: Secondary | ICD-10-CM

## 2017-04-06 DIAGNOSIS — J9801 Acute bronchospasm: Secondary | ICD-10-CM

## 2017-04-06 MED ORDER — CEFTRIAXONE SODIUM 1 G IJ SOLR
1.0000 g | Freq: Once | INTRAMUSCULAR | Status: AC
  Administered 2017-04-06: 1 g via INTRAMUSCULAR

## 2017-04-06 MED ORDER — FLUCONAZOLE 150 MG PO TABS: 150.0000 mg | ORAL_TABLET | Freq: Once | ORAL | 2 refills | Status: AC

## 2017-04-06 MED ORDER — CLARITHROMYCIN 500 MG PO TABS: 500.0000 mg | ORAL_TABLET | Freq: Two times a day (BID) | ORAL | 0 refills | Status: DC

## 2017-04-06 MED ORDER — HYDROCOD POLST-CPM POLST ER 10-8 MG/5ML PO SUER: 5.0000 mL | Freq: Two times a day (BID) | ORAL | 0 refills | Status: DC | PRN

## 2017-04-06 MED ORDER — PREDNISONE 10 MG PO TABS: ORAL_TABLET | ORAL | 0 refills | Status: DC

## 2017-04-06 NOTE — Progress Notes (Signed)
   Subjective:    Patient ID: Cherylynn RidgesKristen H Erlich, female    DOB: 03/01/1986, 31 y.o.   MRN: [id number]  HPI 31 year old Female [facility] August 19 complaining of URI symptoms with wheezing. Says she caught a cold from her son. Was placed on Zithromax Z-Pak, steroid dosepak because she was having sciatica. Was given Phenergan DM cough syrup. Was given Naprosyn for back pain as well as Flexeril.  She called back because she was not improving and was placed on Biaxin about 6 days ago. Has not gotten better.  Complaining of discolored sputum production, cough and fatigue. No fever or shaking chills. Complaining of wheezing and has had to use an inhaler more frequently.    Review of Systems see above-back pain improved with prednisone     Objective:   Physical Exam Left TM is dull. Right TM is clear. Pharynx slightly injected. Neck is supple. Chest is clear today without any rales or wheezing       Assessment & Plan:  Acute bronchospasm  Acute bronchitis  Plan: Sterapred DS 10 mg 6 day dosepak. Refill Biaxin 500 mg twice daily for 10 days to [facility]. Tussionex 1 teaspoon by mouth every 12 hours when necessary cough. May take Delsym during daytime hours since Tussionex causes drowsiness. Rocephin 1 g IM. Rest and drink plenty of fluids. Diflucan for Candida vaginitis.

## 2017-04-06 NOTE — Patient Instructions (Signed)
1 g IM Rocephin. Tussionex every 12 hours as needed for cough but may take Delsym if wants something nondrowsy for cough. Biaxin 500 mg twice daily for 10 days. Prednisone 10 mg( 21 tablets) to take in tapering course as directed and use inhaler as necessary. Rest and drink plenty of fluids. Diflucan prescribed if develops Candida vaginitis.

## 2017-04-10 ENCOUNTER — Other Ambulatory Visit: Payer: Self-pay | Admitting: Internal Medicine

## 2017-04-24 DIAGNOSIS — Z23 Encounter for immunization: Secondary | ICD-10-CM | POA: Diagnosis not present

## 2017-05-09 ENCOUNTER — Other Ambulatory Visit: Payer: Self-pay | Admitting: Internal Medicine

## 2017-05-16 DIAGNOSIS — J209 Acute bronchitis, unspecified: Secondary | ICD-10-CM | POA: Diagnosis not present

## 2017-05-16 DIAGNOSIS — J0100 Acute maxillary sinusitis, unspecified: Secondary | ICD-10-CM | POA: Diagnosis not present

## 2017-05-16 DIAGNOSIS — H6691 Otitis media, unspecified, right ear: Secondary | ICD-10-CM | POA: Diagnosis not present

## 2017-05-30 ENCOUNTER — Other Ambulatory Visit: Payer: Self-pay | Admitting: Internal Medicine

## 2017-06-25 ENCOUNTER — Other Ambulatory Visit: Payer: Self-pay | Admitting: Internal Medicine

## 2017-06-25 ENCOUNTER — Encounter: Payer: Self-pay | Admitting: Internal Medicine

## 2017-06-25 ENCOUNTER — Ambulatory Visit: Payer: BLUE CROSS/BLUE SHIELD | Admitting: Internal Medicine

## 2017-06-25 VITALS — BP 118/86 | HR 96 | Temp 98.3°F | Wt 286.0 lb

## 2017-06-25 DIAGNOSIS — J40 Bronchitis, not specified as acute or chronic: Secondary | ICD-10-CM

## 2017-06-25 MED ORDER — PREDNISONE 10 MG PO TABS: ORAL_TABLET | ORAL | 0 refills | Status: DC

## 2017-06-25 MED ORDER — FLUCONAZOLE 150 MG PO TABS: 150.0000 mg | ORAL_TABLET | Freq: Once | ORAL | 1 refills | Status: DC

## 2017-06-25 MED ORDER — CEFTRIAXONE SODIUM 1 G IJ SOLR
1.0000 g | Freq: Once | INTRAMUSCULAR | Status: AC
  Administered 2017-06-25: 1 g via INTRAMUSCULAR

## 2017-06-25 MED ORDER — CLARITHROMYCIN 500 MG PO TABS: 500.0000 mg | ORAL_TABLET | Freq: Two times a day (BID) | ORAL | 0 refills | Status: DC

## 2017-06-25 MED ORDER — HYDROCOD POLST-CPM POLST ER 10-8 MG/5ML PO SUER: 5.0000 mL | Freq: Two times a day (BID) | ORAL | 0 refills | Status: DC | PRN

## 2017-06-25 MED ORDER — DEXTROMETHORPHAN-GUAIFENESIN 10-100 MG/5ML PO LIQD: 10.0000 mL | ORAL | 0 refills | Status: DC | PRN

## 2017-06-25 MED ORDER — DEXTROMETHORPHAN HBR 15 MG/5ML PO SYRP: 10.0000 mL | ORAL_SOLUTION | Freq: Four times a day (QID) | ORAL | 0 refills | Status: DC | PRN

## 2017-06-25 NOTE — Progress Notes (Signed)
   Subjective:    Patient ID: Cherylynn RidgesKristen H Bond, female    DOB: 01/24/1986, 31 y.o.   MRN: [id number]  HPI  She has come down with respiratory infection symptoms.  She has had wheezing , shortness of breath, discolored sputum.  Has had the infection for a few days but only recently begun to have discolored sputum.  Also has ear pain.  Last treated for similar illness in August.  Has albuterol inhaler but it makes her jittery.  Has been using it some.  No documented fever or shaking chills.  One child at home is ill.   Review of Systems see above     Objective:   Physical Exam Skin warm and dry.  Nodes none.  TMs are slightly dull but not red.  Pharynx very slightly injected without exudate.  Neck is supple.  Chest clear to auscultation without any rales or wheezing today       Assessment & Plan:  Acute bronchitis  Plan: Prednisone 10 mg (#21) taper going from 60 mg to 0 mg over 7 days.  Tussionex 1 teaspoon p.o. every 12 hours as needed cough.  Rocephin 1 g IM.  Biaxin 500 mg twice daily for 10 days.  Diflucan if needed for candidal vaginitis.

## 2017-06-25 NOTE — Patient Instructions (Signed)
Biaxin 500 mg twice daily for 10 days.  Prednisone taper as directed.  Rocephin 1 g IM.  Diflucan if needed for Candida vaginitis.  Tussionex every 12 hours as needed cough.  Rest and drink plenty of fluids.

## 2017-06-26 NOTE — Telephone Encounter (Signed)
Verbal order by Dr. Baxley to refill for 6 months.  Sending to Courtney to send to pharmacy.   °

## 2017-08-30 DIAGNOSIS — H6063 Unspecified chronic otitis externa, bilateral: Secondary | ICD-10-CM | POA: Diagnosis not present

## 2017-09-17 ENCOUNTER — Telehealth: Payer: Self-pay | Admitting: Internal Medicine

## 2017-09-17 DIAGNOSIS — Z7721 Contact with and (suspected) exposure to potentially hazardous body fluids: Secondary | ICD-10-CM

## 2017-09-17 MED ORDER — OSELTAMIVIR PHOSPHATE 75 MG PO CAPS: 75.0000 mg | ORAL_CAPSULE | Freq: Two times a day (BID) | ORAL | 0 refills | Status: DC

## 2017-09-17 NOTE — Telephone Encounter (Signed)
Call in Tamiflu 75 mg twice a day for 5 days

## 2017-09-17 NOTE — Telephone Encounter (Signed)
Patient called stating that her daughter was just diagnosed with the flu this afternoon and she would like to know if a prescription for Tamiflu could be sent in for her [name](Lailie) as a preventative.   Pharmacy: [facility]

## 2017-10-25 ENCOUNTER — Ambulatory Visit: Payer: BLUE CROSS/BLUE SHIELD | Admitting: Internal Medicine

## 2017-10-25 ENCOUNTER — Encounter: Payer: Self-pay | Admitting: Internal Medicine

## 2017-10-25 VITALS — BP 130/90 | HR 92 | Temp 98.1°F | Wt 283.0 lb

## 2017-10-25 DIAGNOSIS — R05 Cough: Secondary | ICD-10-CM

## 2017-10-25 DIAGNOSIS — R52 Pain, unspecified: Secondary | ICD-10-CM | POA: Diagnosis not present

## 2017-10-25 DIAGNOSIS — J029 Acute pharyngitis, unspecified: Secondary | ICD-10-CM | POA: Diagnosis not present

## 2017-10-25 DIAGNOSIS — R059 Cough, unspecified: Secondary | ICD-10-CM

## 2017-10-25 LAB — POCT INFLUENZA A/B
Influenza A, POC: NEGATIVE
Influenza B, POC: NEGATIVE

## 2017-10-25 LAB — POCT RAPID STREP A (OFFICE): Rapid Strep A Screen: NEGATIVE

## 2017-10-25 MED ORDER — HYDROCOD POLST-CPM POLST ER 10-8 MG/5ML PO SUER: 5.0000 mL | Freq: Two times a day (BID) | ORAL | 0 refills | Status: DC | PRN

## 2017-10-25 MED ORDER — CLARITHROMYCIN 500 MG PO TABS: 500.0000 mg | ORAL_TABLET | Freq: Two times a day (BID) | ORAL | 0 refills | Status: DC

## 2017-10-25 NOTE — Patient Instructions (Signed)
Biaxin 500 mg twice daily for 10 days.  Tussionex 1 teaspoon p.o. every 12 hours as needed sore throat pain and cough.  Tylenol for sore throat pain.  Rest and drink plenty of fluids.

## 2017-10-25 NOTE — Progress Notes (Signed)
   Subjective:    Patient ID: Dawn Navarro, female    DOB: 06/20/1986, 32 y.o.   MRN: [id number]  HPI In today with complaint of bad sore throat.  Was exposed to strep throat through her daughter about a week ago.  Does not have nausea or flulike issues.  Rapid flu test is negative.  Rapid strep screen is negative.  Slight cough.  No shaking chills.    Review of Systems see above- nonproductive cough     Objective:   Physical Exam Skin warm and dry.  Pharynx is slightly red without exudate.   Neck is supple.  TMs are clear.  Chest is clear.         Assessment & Plan:  Strep throat exposure with her daughter but rapid strep screen is negative.  Patient is symptomatic.  Plan: Biaxin 500 mg twice daily for 10 days.  Tussionex 1 teaspoon p.o. every 12 hours as needed cough and sore throat pain.

## 2017-11-28 ENCOUNTER — Other Ambulatory Visit: Payer: Self-pay | Admitting: Internal Medicine

## 2018-01-08 ENCOUNTER — Other Ambulatory Visit: Payer: Self-pay | Admitting: Internal Medicine

## 2018-02-12 ENCOUNTER — Other Ambulatory Visit: Payer: Self-pay | Admitting: Internal Medicine

## 2018-02-12 NOTE — Telephone Encounter (Signed)
Cannot refill until books CPE. Last done May 2018

## 2018-02-15 MED ORDER — LEVOTHYROXINE SODIUM 100 MCG PO TABS: 100.0000 ug | ORAL_TABLET | Freq: Every day | ORAL | 0 refills | Status: DC

## 2018-02-15 NOTE — Addendum Note (Signed)
Addended by: Gregery NaVALENCIA, Tiquan Bouch P on: 02/15/2018 11:04 AM   Modules accepted: Orders

## 2018-02-15 NOTE — Telephone Encounter (Signed)
Now please refill x 30 days since she is on the phone

## 2018-03-15 ENCOUNTER — Other Ambulatory Visit: Payer: Self-pay | Admitting: Internal Medicine

## 2018-03-25 ENCOUNTER — Other Ambulatory Visit: Payer: Self-pay | Admitting: Internal Medicine

## 2018-03-25 DIAGNOSIS — R7302 Impaired glucose tolerance (oral): Secondary | ICD-10-CM

## 2018-03-25 DIAGNOSIS — Z6841 Body Mass Index (BMI) 40.0 and over, adult: Secondary | ICD-10-CM

## 2018-03-25 DIAGNOSIS — L410 Pityriasis lichenoides et varioliformis acuta: Secondary | ICD-10-CM

## 2018-03-25 DIAGNOSIS — Z0000 Encounter for general adult medical examination without abnormal findings: Secondary | ICD-10-CM

## 2018-03-25 DIAGNOSIS — F3289 Other specified depressive episodes: Secondary | ICD-10-CM

## 2018-03-25 DIAGNOSIS — E559 Vitamin D deficiency, unspecified: Secondary | ICD-10-CM

## 2018-03-25 DIAGNOSIS — E7849 Other hyperlipidemia: Secondary | ICD-10-CM

## 2018-03-28 ENCOUNTER — Other Ambulatory Visit: Payer: BLUE CROSS/BLUE SHIELD | Admitting: Internal Medicine

## 2018-03-28 DIAGNOSIS — R7302 Impaired glucose tolerance (oral): Secondary | ICD-10-CM

## 2018-03-28 DIAGNOSIS — E559 Vitamin D deficiency, unspecified: Secondary | ICD-10-CM | POA: Diagnosis not present

## 2018-03-28 DIAGNOSIS — F3289 Other specified depressive episodes: Secondary | ICD-10-CM

## 2018-03-28 DIAGNOSIS — E7849 Other hyperlipidemia: Secondary | ICD-10-CM | POA: Diagnosis not present

## 2018-03-28 DIAGNOSIS — L410 Pityriasis lichenoides et varioliformis acuta: Secondary | ICD-10-CM | POA: Diagnosis not present

## 2018-03-28 DIAGNOSIS — Z0000 Encounter for general adult medical examination without abnormal findings: Secondary | ICD-10-CM

## 2018-03-28 DIAGNOSIS — Z6841 Body Mass Index (BMI) 40.0 and over, adult: Secondary | ICD-10-CM

## 2018-03-29 LAB — COMPLETE METABOLIC PANEL WITH GFR
AG Ratio: 1.6 (calc) (ref 1.0–2.5)
ALT: 13 U/L (ref 6–29)
AST: 14 U/L (ref 10–30)
Albumin: 4.1 g/dL (ref 3.6–5.1)
Alkaline phosphatase (APISO): 75 U/L (ref 33–115)
BUN: 10 mg/dL (ref 7–25)
CALCIUM: 9.4 mg/dL (ref 8.6–10.2)
CHLORIDE: 104 mmol/L (ref 98–110)
CO2: 30 mmol/L (ref 20–32)
CREATININE: 0.78 mg/dL (ref 0.50–1.10)
GFR, EST NON AFRICAN AMERICAN: 101 mL/min/{1.73_m2} (ref >=60)
GFR, Est African American: 117 mL/min/{1.73_m2} (ref >=60)
GLUCOSE: 89 mg/dL (ref 65–99)
Globulin: 2.6 g/dL (calc) (ref 1.9–3.7)
Potassium: 4.5 mmol/L (ref 3.5–5.3)
Sodium: 140 mmol/L (ref 135–146)
TOTAL PROTEIN: 6.7 g/dL (ref 6.1–8.1)
Total Bilirubin: 0.4 mg/dL (ref 0.2–1.2)

## 2018-03-29 LAB — LIPID PANEL
CHOLESTEROL: 165 mg/dL (ref <200)
HDL: 44 mg/dL — AB (ref >50)
LDL Cholesterol (Calc): 98 mg/dL (calc)
Non-HDL Cholesterol (Calc): 121 mg/dL (calc) (ref <130)
Total CHOL/HDL Ratio: 3.8 (calc) (ref <5.0)
Triglycerides: 129 mg/dL (ref <150)

## 2018-03-29 LAB — MICROALBUMIN / CREATININE URINE RATIO
Creatinine, Urine: 161 mg/dL (ref 20–275)
MICROALB UR: 0.3 mg/dL
MICROALB/CREAT RATIO: 2 ug/mg{creat} (ref <30)

## 2018-03-29 LAB — CBC WITH DIFFERENTIAL/PLATELET
BASOS ABS: 40 {cells}/uL (ref 0–200)
Basophils Relative: 0.5 %
EOS ABS: 190 {cells}/uL (ref 15–500)
Eosinophils Relative: 2.4 %
HCT: 39.2 % (ref 35.0–45.0)
Hemoglobin: 12.9 g/dL (ref 11.7–15.5)
Lymphs Abs: 2109 cells/uL (ref 850–3900)
MCH: 28.9 pg (ref 27.0–33.0)
MCHC: 32.9 g/dL (ref 32.0–36.0)
MCV: 87.9 fL (ref 80.0–100.0)
MONOS PCT: 6.6 %
MPV: 10.8 fL (ref 7.5–12.5)
NEUTROS PCT: 63.8 %
Neutro Abs: 5040 cells/uL (ref 1500–7800)
PLATELETS: 351 10*3/uL (ref 140–400)
RBC: 4.46 10*6/uL (ref 3.80–5.10)
RDW: 13.5 % (ref 11.0–15.0)
TOTAL LYMPHOCYTE: 26.7 %
WBC: 7.9 10*3/uL (ref 3.8–10.8)
WBCMIX: 521 {cells}/uL (ref 200–950)

## 2018-03-29 LAB — VITAMIN D 25 HYDROXY (VIT D DEFICIENCY, FRACTURES): Vit D, 25-Hydroxy: 65 ng/mL (ref 30–100)

## 2018-03-29 LAB — TSH: TSH: 0.94 mIU/L

## 2018-03-29 LAB — HEMOGLOBIN A1C
EAG (MMOL/L): 6.8 (calc)
Hgb A1c MFr Bld: 5.9 % of total Hgb — ABNORMAL HIGH (ref <5.7)
Mean Plasma Glucose: 123 (calc)

## 2018-04-02 ENCOUNTER — Ambulatory Visit (INDEPENDENT_AMBULATORY_CARE_PROVIDER_SITE_OTHER): Payer: BLUE CROSS/BLUE SHIELD | Admitting: Internal Medicine

## 2018-04-02 ENCOUNTER — Encounter: Payer: Self-pay | Admitting: Internal Medicine

## 2018-04-02 VITALS — BP 110/80 | HR 115 | Ht 67.0 in | Wt 286.0 lb

## 2018-04-02 DIAGNOSIS — L239 Allergic contact dermatitis, unspecified cause: Secondary | ICD-10-CM | POA: Diagnosis not present

## 2018-04-02 DIAGNOSIS — R51 Headache: Secondary | ICD-10-CM | POA: Diagnosis not present

## 2018-04-02 DIAGNOSIS — G4700 Insomnia, unspecified: Secondary | ICD-10-CM

## 2018-04-02 DIAGNOSIS — R7302 Impaired glucose tolerance (oral): Secondary | ICD-10-CM | POA: Diagnosis not present

## 2018-04-02 DIAGNOSIS — Z23 Encounter for immunization: Secondary | ICD-10-CM

## 2018-04-02 DIAGNOSIS — Z6841 Body Mass Index (BMI) 40.0 and over, adult: Secondary | ICD-10-CM

## 2018-04-02 DIAGNOSIS — E282 Polycystic ovarian syndrome: Secondary | ICD-10-CM | POA: Diagnosis not present

## 2018-04-02 DIAGNOSIS — L410 Pityriasis lichenoides et varioliformis acuta: Secondary | ICD-10-CM

## 2018-04-02 DIAGNOSIS — Z8669 Personal history of other diseases of the nervous system and sense organs: Secondary | ICD-10-CM

## 2018-04-02 DIAGNOSIS — F3289 Other specified depressive episodes: Secondary | ICD-10-CM

## 2018-04-02 DIAGNOSIS — E781 Pure hyperglyceridemia: Secondary | ICD-10-CM

## 2018-04-02 DIAGNOSIS — R519 Headache, unspecified: Secondary | ICD-10-CM

## 2018-04-02 DIAGNOSIS — Z0000 Encounter for general adult medical examination without abnormal findings: Secondary | ICD-10-CM | POA: Diagnosis not present

## 2018-04-02 LAB — POCT URINALYSIS DIPSTICK
APPEARANCE: NORMAL
BILIRUBIN UA: NEGATIVE
Glucose, UA: NEGATIVE
Ketones, UA: NEGATIVE
Leukocytes, UA: NEGATIVE
Nitrite, UA: NEGATIVE
Odor: NORMAL
Protein, UA: NEGATIVE
RBC UA: NEGATIVE
Spec Grav, UA: 1.015 (ref 1.010–1.025)
Urobilinogen, UA: 0.2 E.U./dL
pH, UA: 6.5 (ref 5.0–8.0)

## 2018-04-02 MED ORDER — METFORMIN HCL 500 MG PO TABS: ORAL_TABLET | ORAL | 3 refills | Status: DC

## 2018-04-02 MED ORDER — TRIAMCINOLONE ACETONIDE 0.1 % EX CREA: 1.0000 "application " | TOPICAL_CREAM | Freq: Three times a day (TID) | CUTANEOUS | 0 refills | Status: DC

## 2018-04-02 MED ORDER — ATORVASTATIN CALCIUM 20 MG PO TABS: 20.0000 mg | ORAL_TABLET | Freq: Every day | ORAL | 3 refills | Status: DC

## 2018-04-02 MED ORDER — BUTALBITAL-APAP-CAFFEINE 50-325-40 MG PO TABS: 1.0000 | ORAL_TABLET | Freq: Four times a day (QID) | ORAL | 0 refills | Status: DC | PRN

## 2018-04-02 NOTE — Patient Instructions (Signed)
Start metformin 500 mg daily RTC 6 months. Refer to Dr. Dalbert GarnetBeasley. Take Fioricet sparing for headache. Continue lipid lowering medication. triamcinomlone for contact dermatitis right arm. Flu vaccine given.

## 2018-04-02 NOTE — Progress Notes (Signed)
Subjective:    Patient ID: Dawn Navarro, female    DOB: January 29, 1986, 32 y.o.   MRN: [id number]  HPI 32 year old Female in today for health maintenance exam and evaluation of medical issues.  History of hypothyroidism, hyperlipidemia, migraine headaches, depression, morbid obesity.  History of PLEVA treated by Dr. Rozann Lesches, dermatologist.  History of polycystic ovary syndrome.  Recommended Dr. Leafy Ro to patient.  Was given information to make an appointment.  She is actually gained 13 pounds since May 2018.  Her BMI is 44.79.  Now weighs 286 pounds and previously weighed 273 pounds in May 2018.  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  History of allergic rhinitis and recurrent ear infections.  Patient is frustrated that she still has some clean the lesions on her neck.  Suggested she speak with dermatologist.  She was told it might take 2 years to go away and is still present after about 2 years.  She took Clomid to get pregnant for the first time.  First child was delivered February 2012 by C-section for failure to progress.  Has frequent respiratory infections.  Has been on thyroid replacement since 2010.  Has given up caffeinated soft drinks and has developed apparent caffeine withdrawal headache.  Requesting Fioricet prescription which was provided today to take sparingly.  [facility] does her GYN care.  She has a Mirena and she will see them for Pap smear.  In March 2012 she had Pap smear with ASCUS.  Social history: She is married.  Has a 4 year college degree.  Works as an Passenger transport manager for [employer].  She does not smoke or consume alcohol.  Has 2 children.  Family history: Mother with history of hypothyroidism.  Father with history of non-Hodgkin's lymphoma and remote history of prostate cancer.  2 brothers in good health.  Both parents with hyperlipidemia.  Patient has history of low HDL cholesterol.  Total cholesterol triglycerides and LDL  cholesterol are normal on statin medication.  Over the past year she has developed impaired glucose tolerance with hemoglobin A1c 5.9% and previously was 5.6%.  She has a history of vitamin D deficiency.  Last year level was 26 and is now 6.  With regard to hypothyroidism TSH is normal on current dose of thyroid replacement.  CBC and C met are normal.    Review of Systems says she recently had a bout of pinkeye for which she used some eyedrops that had been previously prescribed.  She has rash on right forearm antecubital fossa which looks like contact dermatitis and triamcinolone cream has been prescribed.     Objective:   Physical Exam  Constitutional: She is oriented to person, place, and time. She appears well-developed and well-nourished. No distress.  HENT:  Head: Normocephalic and atraumatic.  Mouth/Throat: Oropharynx is clear and moist.  Eyes: Pupils are equal, round, and reactive to light. EOM are normal. No scleral icterus. Left eye exhibits normal extraocular motion. Right pupil is round and reactive. Left pupil is round and reactive.  Neck: Neck supple.  Cardiovascular: Normal rate, regular rhythm, normal heart sounds and intact distal pulses.  No murmur heard. Pulmonary/Chest: Effort normal. No respiratory distress. She has no wheezes.  Breasts normal female without masses  Abdominal: Soft. Bowel sounds are normal. She exhibits no distension and no mass. There is no tenderness. There is no guarding.  Lymphadenopathy:    She has no cervical adenopathy.  Neurological: She is alert and oriented to person, place, and  time. She has normal strength. She is not disoriented. No cranial nerve deficit or sensory deficit.  Skin: Skin is warm and dry. No rash noted. She is not diaphoretic. No cyanosis or erythema.  Psychiatric: She has a normal mood and affect. Her behavior is normal.  Vitals reviewed.         Assessment & Plan:  Morbid obesity.  BMI 44.70 with 13 pound  weight gain over the past year.  Refer to Dr. Migdalia Dk clinic.  Patient given information to call and make her own appointment.  Encourage diet exercise and weight loss.  Hypothyroidism-stable on thyroid replacement  History of PLEVA-seen by dermatologist  Contact dermatitis right antecubital fossa to be treated with triamcinolone cream 3 times daily  Hypothyroidism-TSH stable on current dose of thyroid replacement  History of vitamin D deficiency but currently vitamin D is within normal limits  Impaired glucose tolerance.  Patient has not wanted to be on metformin.  Will be started on metformin 500 mg daily.  History of migraine headaches and caffeine withdrawal headaches to be treated sparingly with Fioricet.  Hyperlipidemia continue Lipitor 20 mg daily.    History of PLEVA treated by dermatologist  Contact dermatitis right antecubital fossa treated with triamcinolone cream 3 times daily.  Looks like it could be poison ivy.  Patient has been gathering up pain with her family.  Plan: Referral to Dr. Migdalia Dk clinic.  Continue same medications and follow-up in 6 months or as needed.  Will need fasting lipid panel and liver functions at the time.

## 2018-04-10 ENCOUNTER — Other Ambulatory Visit: Payer: Self-pay | Admitting: Internal Medicine

## 2018-04-24 DIAGNOSIS — Z01419 Encounter for gynecological examination (general) (routine) without abnormal findings: Secondary | ICD-10-CM | POA: Diagnosis not present

## 2018-04-24 DIAGNOSIS — Z6841 Body Mass Index (BMI) 40.0 and over, adult: Secondary | ICD-10-CM | POA: Diagnosis not present

## 2018-05-16 ENCOUNTER — Other Ambulatory Visit: Payer: Self-pay | Admitting: Internal Medicine

## 2018-05-31 ENCOUNTER — Other Ambulatory Visit: Payer: Self-pay | Admitting: Internal Medicine

## 2018-06-13 ENCOUNTER — Ambulatory Visit: Payer: BLUE CROSS/BLUE SHIELD | Admitting: Internal Medicine

## 2018-06-13 ENCOUNTER — Encounter: Payer: Self-pay | Admitting: Internal Medicine

## 2018-06-13 VITALS — BP 110/80 | HR 95 | Temp 98.0°F | Ht 67.0 in | Wt 282.0 lb

## 2018-06-13 DIAGNOSIS — J22 Unspecified acute lower respiratory infection: Secondary | ICD-10-CM

## 2018-06-13 DIAGNOSIS — H9201 Otalgia, right ear: Secondary | ICD-10-CM

## 2018-06-13 MED ORDER — HYDROCODONE-HOMATROPINE 5-1.5 MG/5ML PO SYRP: 5.0000 mL | ORAL_SOLUTION | Freq: Three times a day (TID) | ORAL | 0 refills | Status: DC | PRN

## 2018-06-13 MED ORDER — FLUCONAZOLE 150 MG PO TABS: 150.0000 mg | ORAL_TABLET | Freq: Once | ORAL | 1 refills | Status: AC

## 2018-06-13 MED ORDER — CLARITHROMYCIN 500 MG PO TABS: 500.0000 mg | ORAL_TABLET | Freq: Two times a day (BID) | ORAL | 0 refills | Status: DC

## 2018-06-13 NOTE — Progress Notes (Signed)
   Subjective:    Patient ID: Dawn Navarro, female    DOB: Dec 18, 1985, 32 y.o.   MRN: [id number]  HPI Onset on November 2 of acute respiratory infection symptoms.  Has had acute right ear pain since that time.  Has cough which is somewhat productive.  No fever or shaking chills.  Has sore throat.  Her children have similar illness.  Cannot sleep well at night due to cough.    Review of Systems feels right ear pressure and feels that it is stopped     Objective:   Physical Exam Both TMs are chronically scarred but not red.  Pharynx is red without exudate.  Neck is supple without adenopathy.  Chest clear to auscultation.  She sounds nasally congested.  Has deep congested cough.       Assessment & Plan:  Acute lower respiratory infection  Right otalgia  Plan: Biaxin 500 mg twice daily for 10 days.  Hycodan 1 teaspoon p.o. every 8 hours as needed cough.  Diflucan 150 mg tablet with 1 refill.  Rest and drink plenty of fluids.  Tylenol if needed for fever/pain.

## 2018-06-13 NOTE — Patient Instructions (Signed)
Biaxin 500 mg twice daily for 10 days.  Hycodan 1 teaspoon p.o. every 8 hours as needed cough.  Diflucan 150 mg with 1 refill as needed for Candida vaginitis.

## 2018-08-12 DIAGNOSIS — H02052 Trichiasis without entropian right lower eyelid: Secondary | ICD-10-CM | POA: Diagnosis not present

## 2018-10-01 ENCOUNTER — Other Ambulatory Visit: Payer: BLUE CROSS/BLUE SHIELD | Admitting: Internal Medicine

## 2018-10-01 ENCOUNTER — Telehealth: Payer: Self-pay | Admitting: Internal Medicine

## 2018-10-01 NOTE — Telephone Encounter (Signed)
Patient called earlier to reschedule follow up appt. I set her up on 10/04/2018 because it was available in the book at 2:45pm. When I went to change it in the computer it was already taken by another patient. Tried to call pt back to reschedule but had to leave a voicemail

## 2018-10-03 ENCOUNTER — Ambulatory Visit: Payer: BLUE CROSS/BLUE SHIELD | Admitting: Internal Medicine

## 2018-10-03 ENCOUNTER — Other Ambulatory Visit: Payer: BLUE CROSS/BLUE SHIELD | Admitting: Internal Medicine

## 2018-10-03 DIAGNOSIS — E781 Pure hyperglyceridemia: Secondary | ICD-10-CM | POA: Diagnosis not present

## 2018-10-03 DIAGNOSIS — E039 Hypothyroidism, unspecified: Secondary | ICD-10-CM | POA: Diagnosis not present

## 2018-10-03 DIAGNOSIS — R7302 Impaired glucose tolerance (oral): Secondary | ICD-10-CM | POA: Diagnosis not present

## 2018-10-04 ENCOUNTER — Ambulatory Visit: Payer: BLUE CROSS/BLUE SHIELD | Admitting: Internal Medicine

## 2018-10-04 ENCOUNTER — Encounter: Payer: Self-pay | Admitting: Internal Medicine

## 2018-10-04 VITALS — BP 110/80 | HR 82 | Temp 98.3°F | Ht 67.0 in | Wt 276.0 lb

## 2018-10-04 DIAGNOSIS — Z8669 Personal history of other diseases of the nervous system and sense organs: Secondary | ICD-10-CM

## 2018-10-04 DIAGNOSIS — Z20818 Contact with and (suspected) exposure to other bacterial communicable diseases: Secondary | ICD-10-CM

## 2018-10-04 DIAGNOSIS — R7302 Impaired glucose tolerance (oral): Secondary | ICD-10-CM

## 2018-10-04 DIAGNOSIS — Z8659 Personal history of other mental and behavioral disorders: Secondary | ICD-10-CM

## 2018-10-04 DIAGNOSIS — Z6841 Body Mass Index (BMI) 40.0 and over, adult: Secondary | ICD-10-CM

## 2018-10-04 DIAGNOSIS — E039 Hypothyroidism, unspecified: Secondary | ICD-10-CM

## 2018-10-04 LAB — HEPATIC FUNCTION PANEL
AG Ratio: 1.5 (calc) (ref 1.0–2.5)
ALKALINE PHOSPHATASE (APISO): 77 U/L (ref 31–125)
ALT: 14 U/L (ref 6–29)
AST: 14 U/L (ref 10–30)
Albumin: 4.1 g/dL (ref 3.6–5.1)
BILIRUBIN INDIRECT: 0.2 mg/dL (ref 0.2–1.2)
Bilirubin, Direct: 0.1 mg/dL (ref 0.0–0.2)
Globulin: 2.7 g/dL (calc) (ref 1.9–3.7)
TOTAL PROTEIN: 6.8 g/dL (ref 6.1–8.1)
Total Bilirubin: 0.3 mg/dL (ref 0.2–1.2)

## 2018-10-04 LAB — LIPID PANEL
CHOL/HDL RATIO: 3.7 (calc) (ref <5.0)
Cholesterol: 157 mg/dL (ref <200)
HDL: 42 mg/dL — AB (ref >=50)
LDL CHOLESTEROL (CALC): 93 mg/dL
NON-HDL CHOLESTEROL (CALC): 115 mg/dL (ref <130)
TRIGLYCERIDES: 120 mg/dL (ref <150)

## 2018-10-04 LAB — TSH: TSH: 0.59 mIU/L

## 2018-10-04 LAB — HEMOGLOBIN A1C
Hgb A1c MFr Bld: 5.7 % of total Hgb — ABNORMAL HIGH (ref <5.7)
Mean Plasma Glucose: 117 (calc)
eAG (mmol/L): 6.5 (calc)

## 2018-10-04 LAB — MICROALBUMIN / CREATININE URINE RATIO
Creatinine, Urine: 142 mg/dL (ref 20–275)
Microalb Creat Ratio: 2 mcg/mg creat (ref <30)
Microalb, Ur: 0.3 mg/dL

## 2018-10-04 LAB — POCT RAPID STREP A (OFFICE): Rapid Strep A Screen: NEGATIVE

## 2018-10-04 MED ORDER — BUTALBITAL-APAP-CAFFEINE 50-325-40 MG PO TABS: 1.0000 | ORAL_TABLET | Freq: Four times a day (QID) | ORAL | 0 refills | Status: AC | PRN

## 2018-10-04 NOTE — Patient Instructions (Signed)
It was a pleasure to see you today.  Continue diet and exercise efforts and return in 6 months for physical exam.  Rapid strep screen is negative.  Please consider contacting Dr. Dalbert Garnet for weight loss management

## 2018-10-04 NOTE — Progress Notes (Signed)
   Subjective:    Patient ID: Dawn Navarro, female    DOB: Jun 26, 1986, 33 y.o.   MRN: [id number]  HPI 33 year old Female in today for 48-month follow-up on hypothyroidism, hyperlipidemia, migraine headaches, morbid obesity, impaired glucose tolerance.  She is on metformin and has responded well TSH is decreased from 5.9% 5.7%.  Lipid panel is normal.  HDL is 42.  History of low HDL cholesterol.  Has not contacted Dr. Francena Hanly clinic.  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  Has had strep throat exposure through her daughter.  Has had headache recently for the past several days.  Cannot find her Fioricet prescription.     Review of Systems headache for 3 days.  No fever or chills.     Objective:   Physical Exam Blood pressure 110/80.  BMI 43.23.  Weight 276 pounds.  Has lost 10 pounds since August 2019. Skin warm and dry.  Pharynx slightly injected.  Rapid strep screen negative.  TMs clear.  Chest clear.  Neck is supple without thyromegaly.      Assessment & Plan:  Strep throat exposure-strep screen is negative  Hypothyroidism-TSH normal on thyroid replacement  Morbid obesity-encouraged her to see Dr. Dalbert Garnet  Impaired glucose tolerance now on metformin and hemoglobin A1c is 5.7%  Hyperlipidemia-is on Lipitor 20 mg daily and lipid panel is normal  Low HDL cholesterol at 42  History of migraine headaches  Plan: Fioricet refilled.  Continue metformin for impaired glucose tolerance.  Continue atorvastatin for hyperlipidemia.  Continue diet and exercise.  Strep screen is negative despite strep throat exposure.  Encourage patient to attend Dr. Francena Hanly clinic.  Return in 6 months for physical exam.

## 2018-10-21 ENCOUNTER — Ambulatory Visit: Payer: BLUE CROSS/BLUE SHIELD | Admitting: Internal Medicine

## 2018-10-21 ENCOUNTER — Other Ambulatory Visit: Payer: Self-pay

## 2018-10-21 ENCOUNTER — Encounter: Payer: Self-pay | Admitting: Internal Medicine

## 2018-10-21 VITALS — BP 120/80 | HR 68 | Temp 98.5°F | Ht 67.0 in | Wt 267.0 lb

## 2018-10-21 DIAGNOSIS — R05 Cough: Secondary | ICD-10-CM

## 2018-10-21 DIAGNOSIS — J22 Unspecified acute lower respiratory infection: Secondary | ICD-10-CM | POA: Diagnosis not present

## 2018-10-21 DIAGNOSIS — H6503 Acute serous otitis media, bilateral: Secondary | ICD-10-CM | POA: Diagnosis not present

## 2018-10-21 DIAGNOSIS — R059 Cough, unspecified: Secondary | ICD-10-CM

## 2018-10-21 MED ORDER — HYDROCOD POLST-CPM POLST ER 10-8 MG/5ML PO SUER: 5.0000 mL | Freq: Two times a day (BID) | ORAL | Status: DC | PRN

## 2018-10-21 MED ORDER — CLARITHROMYCIN 500 MG PO TABS: 500.0000 mg | ORAL_TABLET | Freq: Two times a day (BID) | ORAL | 0 refills | Status: DC

## 2018-10-21 MED ORDER — HYDROCOD POLST-CPM POLST ER 10-8 MG/5ML PO SUER: 5.0000 mL | Freq: Two times a day (BID) | ORAL | 0 refills | Status: DC | PRN

## 2018-10-24 ENCOUNTER — Other Ambulatory Visit: Payer: Self-pay | Admitting: Internal Medicine

## 2018-11-03 NOTE — Progress Notes (Signed)
   Subjective:    Patient ID: Cherylynn Ridges, female    DOB: [birth year], 33 y.o.   MRN: [id number]  HPI 33 year old Female in today with sore throat headache and cough.  No travel history.  No fever or shaking chills.  She has frequent respiratory infections.  No myalgias.  No nausea vomiting or diarrhea.  Cough is somewhat productive.    Review of Systems see above     Objective:   Physical Exam Vital signs reviewed.  Temperature 98.5 degrees pulse oximetry 97%.  Skin warm and dry.  Nodes none.  Pharynx slightly injected.  Neck is supple.  TMs are slightly full.  Chest clear to auscultation.       Assessment & Plan:  Acute lower respiratory infection  Acute bilateral serous otitis media  Plan: Biaxin 500 mg twice daily for 10 days.  Diflucan if needed for Candida vaginitis.  Tussionex 1 teaspoon p.o. every 12 hours as needed cough and sore throat pain.  Rest and drink plenty of fluids.  Has albuterol inhaler should wheezing develop.

## 2018-11-03 NOTE — Patient Instructions (Signed)
Take Biaxin twice daily with food for 10 days.  Tussionex if needed for cough and sore throat pain.  Rest and drink plenty of fluids.  Diflucan if needed for Candida vaginitis.

## 2018-11-22 ENCOUNTER — Other Ambulatory Visit: Payer: Self-pay | Admitting: Internal Medicine

## 2018-12-10 ENCOUNTER — Other Ambulatory Visit: Payer: Self-pay | Admitting: Internal Medicine

## 2019-04-04 ENCOUNTER — Other Ambulatory Visit: Payer: BC Managed Care – PPO | Admitting: Internal Medicine

## 2019-04-04 ENCOUNTER — Other Ambulatory Visit: Payer: Self-pay

## 2019-04-04 DIAGNOSIS — Z0000 Encounter for general adult medical examination without abnormal findings: Secondary | ICD-10-CM

## 2019-04-04 DIAGNOSIS — E781 Pure hyperglyceridemia: Secondary | ICD-10-CM | POA: Diagnosis not present

## 2019-04-04 DIAGNOSIS — R519 Headache, unspecified: Secondary | ICD-10-CM

## 2019-04-04 DIAGNOSIS — E039 Hypothyroidism, unspecified: Secondary | ICD-10-CM | POA: Diagnosis not present

## 2019-04-04 DIAGNOSIS — R7302 Impaired glucose tolerance (oral): Secondary | ICD-10-CM | POA: Diagnosis not present

## 2019-04-05 LAB — COMPLETE METABOLIC PANEL WITH GFR
AG Ratio: 1.7 (calc) (ref 1.0–2.5)
ALT: 15 U/L (ref 6–29)
AST: 14 U/L (ref 10–30)
Albumin: 4.2 g/dL (ref 3.6–5.1)
Alkaline phosphatase (APISO): 68 U/L (ref 31–125)
BUN: 7 mg/dL (ref 7–25)
CO2: 28 mmol/L (ref 20–32)
Calcium: 9 mg/dL (ref 8.6–10.2)
Chloride: 105 mmol/L (ref 98–110)
Creat: 0.74 mg/dL (ref 0.50–1.10)
GFR, Est African American: 123 mL/min/{1.73_m2} (ref >=60)
GFR, Est Non African American: 106 mL/min/{1.73_m2} (ref >=60)
Globulin: 2.5 g/dL (calc) (ref 1.9–3.7)
Glucose, Bld: 88 mg/dL (ref 65–99)
Potassium: 4.6 mmol/L (ref 3.5–5.3)
Sodium: 140 mmol/L (ref 135–146)
Total Bilirubin: 0.4 mg/dL (ref 0.2–1.2)
Total Protein: 6.7 g/dL (ref 6.1–8.1)

## 2019-04-05 LAB — CBC WITH DIFFERENTIAL/PLATELET
Absolute Monocytes: 416 cells/uL (ref 200–950)
Basophils Absolute: 40 cells/uL (ref 0–200)
Basophils Relative: 0.6 %
Eosinophils Absolute: 33 cells/uL (ref 15–500)
Eosinophils Relative: 0.5 %
HCT: 39.7 % (ref 35.0–45.0)
Hemoglobin: 13.1 g/dL (ref 11.7–15.5)
Lymphs Abs: 1980 cells/uL (ref 850–3900)
MCH: 29.6 pg (ref 27.0–33.0)
MCHC: 33 g/dL (ref 32.0–36.0)
MCV: 89.8 fL (ref 80.0–100.0)
MPV: 10.7 fL (ref 7.5–12.5)
Monocytes Relative: 6.3 %
Neutro Abs: 4132 cells/uL (ref 1500–7800)
Neutrophils Relative %: 62.6 %
Platelets: 324 10*3/uL (ref 140–400)
RBC: 4.42 10*6/uL (ref 3.80–5.10)
RDW: 13.2 % (ref 11.0–15.0)
Total Lymphocyte: 30 %
WBC: 6.6 10*3/uL (ref 3.8–10.8)

## 2019-04-05 LAB — VITAMIN D 25 HYDROXY (VIT D DEFICIENCY, FRACTURES): Vit D, 25-Hydroxy: 40 ng/mL (ref 30–100)

## 2019-04-05 LAB — HEMOGLOBIN A1C
Hgb A1c MFr Bld: 5.8 % of total Hgb — ABNORMAL HIGH (ref <5.7)
Mean Plasma Glucose: 120 (calc)
eAG (mmol/L): 6.6 (calc)

## 2019-04-05 LAB — LIPID PANEL
Cholesterol: 154 mg/dL (ref <200)
HDL: 38 mg/dL — ABNORMAL LOW (ref >=50)
LDL Cholesterol (Calc): 87 mg/dL (calc)
Non-HDL Cholesterol (Calc): 116 mg/dL (calc) (ref <130)
Total CHOL/HDL Ratio: 4.1 (calc) (ref <5.0)
Triglycerides: 192 mg/dL — ABNORMAL HIGH (ref <150)

## 2019-04-05 LAB — TSH: TSH: 0.87 mIU/L

## 2019-04-07 ENCOUNTER — Encounter: Payer: Self-pay | Admitting: Internal Medicine

## 2019-04-07 ENCOUNTER — Other Ambulatory Visit: Payer: Self-pay

## 2019-04-07 ENCOUNTER — Ambulatory Visit (INDEPENDENT_AMBULATORY_CARE_PROVIDER_SITE_OTHER): Payer: BC Managed Care – PPO | Admitting: Internal Medicine

## 2019-04-07 ENCOUNTER — Telehealth: Payer: Self-pay | Admitting: Internal Medicine

## 2019-04-07 ENCOUNTER — Encounter: Payer: BLUE CROSS/BLUE SHIELD | Admitting: Internal Medicine

## 2019-04-07 DIAGNOSIS — J22 Unspecified acute lower respiratory infection: Secondary | ICD-10-CM

## 2019-04-07 DIAGNOSIS — R059 Cough, unspecified: Secondary | ICD-10-CM

## 2019-04-07 DIAGNOSIS — R05 Cough: Secondary | ICD-10-CM | POA: Diagnosis not present

## 2019-04-07 DIAGNOSIS — R0981 Nasal congestion: Secondary | ICD-10-CM | POA: Diagnosis not present

## 2019-04-07 DIAGNOSIS — R6889 Other general symptoms and signs: Secondary | ICD-10-CM | POA: Diagnosis not present

## 2019-04-07 DIAGNOSIS — Z20822 Contact with and (suspected) exposure to covid-19: Secondary | ICD-10-CM

## 2019-04-07 DIAGNOSIS — J029 Acute pharyngitis, unspecified: Secondary | ICD-10-CM | POA: Diagnosis not present

## 2019-04-07 MED ORDER — HYDROCOD POLST-CPM POLST ER 10-8 MG/5ML PO SUER: 5.0000 mL | Freq: Two times a day (BID) | ORAL | 0 refills | Status: DC | PRN

## 2019-04-07 MED ORDER — CLARITHROMYCIN 500 MG PO TABS: 500.0000 mg | ORAL_TABLET | Freq: Two times a day (BID) | ORAL | 0 refills | Status: DC

## 2019-04-07 MED ORDER — FLUCONAZOLE 150 MG PO TABS: 150.0000 mg | ORAL_TABLET | Freq: Once | ORAL | 1 refills | Status: AC

## 2019-04-07 NOTE — Progress Notes (Signed)
   Subjective:    Patient ID: Dawn Navarro, female    DOB: August 10, 1985, 33 y.o.   MRN: [id number]  HPI 33 year old Female seen by [employer] today due to the Coronavirus pandemic.  She is identified using 2 identifiers is Dawn Navarro. Laurance Flatten, a patient in this practice.  She is agreeable to visit in this format today.  It seems that patient has been living in her camper with her husband and 2 children for several weeks.  Her babysitters son was tested for COVID-19 but apparently the test was negative.  He had developed respiratory infection symptoms.  Patient's children now have respiratory infection symptoms as well.  Patient has URI symptoms with runny nose, sore throat, cough, no discolored sputum.  No fever.  No myalgias.  No headache and no dysgeusia.  She does work at [employer] where there has been [place] exposure but she is not been directly involved with those employees.    Review of Systems see above     Objective:   Physical Exam Reports being afebrile.  She looks fatigued.  Sounds nasally congested.       Assessment & Plan:  Acute respiratory infection  Plan: Arrange for COVID-19 testing at [facility]  Biaxin 500 mg twice daily for 10 days.  Do not take statin medication while on Biaxin.  Diflucan 150 mg tablet with 1 refill for Candida vaginitis should symptoms develop while on antibiotics.  Tussionex 1 teaspoon p.o. every 12 hours as needed cough.  Rest and drink plenty of fluids.  Out of work until COVID-19 test results are back.

## 2019-04-07 NOTE — Patient Instructions (Signed)
Biaxin 500 mg twice daily for 10 days.  Do not take statin medication while you are taking Biaxin.  Diflucan 150 mg tablet with 1 refill if you develop Candida vaginitis symptoms while on antibiotics.  Tussionex 1 teaspoon p.o. every 12 hours as needed cough.  Rest and drink plenty of fluids.  Had COVID-19 testing at [facility] site.  Out of work until results are known.

## 2019-04-07 NOTE — Telephone Encounter (Signed)
Dawn Navarro [phone]  [name] called to say that she had her CPE scheduled for today, however over the weekend she started feeling sick, stuffy nose, sore throat, drainage, night cough, both of her chil;dren had been sick last week and one little boy they had been a round started getting sick on Monday and was tested for COVID on Tuesday, but it was negative.

## 2019-04-07 NOTE — Telephone Encounter (Signed)
After speaking with Dr Renold Genta, we canceled CPE office visit and made it a virtual visit to discuss what was going on.

## 2019-04-08 LAB — NOVEL CORONAVIRUS, NAA: SARS-CoV-2, NAA: NOT DETECTED

## 2019-04-10 ENCOUNTER — Other Ambulatory Visit: Payer: Self-pay

## 2019-04-10 MED ORDER — ATORVASTATIN CALCIUM 20 MG PO TABS: 20.0000 mg | ORAL_TABLET | Freq: Every day | ORAL | 1 refills | Status: DC

## 2019-04-10 MED ORDER — LEVOTHYROXINE SODIUM 100 MCG PO TABS: 100.0000 ug | ORAL_TABLET | Freq: Every day | ORAL | 1 refills | Status: DC

## 2019-04-10 MED ORDER — METFORMIN HCL 500 MG PO TABS: ORAL_TABLET | ORAL | 1 refills | Status: DC

## 2019-04-10 MED ORDER — SERTRALINE HCL 50 MG PO TABS: 50.0000 mg | ORAL_TABLET | Freq: Every day | ORAL | 1 refills | Status: DC

## 2019-04-23 ENCOUNTER — Telehealth: Payer: Self-pay

## 2019-04-23 NOTE — Telephone Encounter (Signed)
Patient called she had a virtual visit about 3 weeks ago for a sinu infection, she took all of her medicine and she felt okay but now she feels like the infection is coming back, has runny nose and a cough. Temperature is 97.9. She like to know if can give her something else.

## 2019-04-23 NOTE — Telephone Encounter (Signed)
Wait it out and see if she develops more symptoms. She needs to try to let it run its course. It could just be allergies or a cold.

## 2019-04-23 NOTE — Telephone Encounter (Signed)
Left detailed message.   

## 2019-06-26 DIAGNOSIS — Z01419 Encounter for gynecological examination (general) (routine) without abnormal findings: Secondary | ICD-10-CM | POA: Diagnosis not present

## 2019-06-26 DIAGNOSIS — Z6841 Body Mass Index (BMI) 40.0 and over, adult: Secondary | ICD-10-CM | POA: Diagnosis not present

## 2019-06-26 DIAGNOSIS — Z124 Encounter for screening for malignant neoplasm of cervix: Secondary | ICD-10-CM | POA: Diagnosis not present

## 2019-10-23 ENCOUNTER — Telehealth: Payer: Self-pay | Admitting: Internal Medicine

## 2019-10-23 MED ORDER — METFORMIN HCL 500 MG PO TABS: ORAL_TABLET | ORAL | 0 refills | Status: DC

## 2019-10-23 NOTE — Telephone Encounter (Signed)
Dawn Navarro called and scheduled her CPE and Labs and she also needs a refill on below medication to get her thru to then. The pharmacy had told her they sent request and we had not responded.  metFORMIN (GLUCOPHAGE) 500 MG tablet  [address] [place], [place] - [address] [place] [address] Phone:  [phone]  Fax:  [phone]

## 2019-11-28 ENCOUNTER — Telehealth: Payer: Self-pay | Admitting: Internal Medicine

## 2019-11-28 ENCOUNTER — Telehealth: Payer: Self-pay

## 2019-11-28 MED ORDER — ATORVASTATIN CALCIUM 20 MG PO TABS: 20.0000 mg | ORAL_TABLET | Freq: Every day | ORAL | 1 refills | Status: DC

## 2019-11-28 MED ORDER — LEVOTHYROXINE SODIUM 100 MCG PO TABS: 100.0000 ug | ORAL_TABLET | Freq: Every day | ORAL | 0 refills | Status: DC

## 2019-11-28 NOTE — Telephone Encounter (Signed)
Patient called states that her pharmacy has sent [place] 3 refill request and I dont see where we got anything on her behalf.  Patient would like:  Received Fax RX request from  Patient called   Pharmacy -  [facility] in [place]   Medication -  Atorvastatin 20 mg tabs and Levothyroxine 20 mg tabs  Last Refill -   04-10-19   Last OV -  04-07-19  Last CPE -  04-02-18  Next Appointment -   12-08-19

## 2019-11-28 NOTE — Telephone Encounter (Signed)
Have refilled meds as requested. Must keep upcoming appointment.

## 2019-12-04 ENCOUNTER — Other Ambulatory Visit: Payer: BC Managed Care – PPO | Admitting: Internal Medicine

## 2019-12-04 ENCOUNTER — Other Ambulatory Visit: Payer: Self-pay

## 2019-12-04 DIAGNOSIS — E781 Pure hyperglyceridemia: Secondary | ICD-10-CM

## 2019-12-04 DIAGNOSIS — E039 Hypothyroidism, unspecified: Secondary | ICD-10-CM

## 2019-12-04 DIAGNOSIS — Z1321 Encounter for screening for nutritional disorder: Secondary | ICD-10-CM

## 2019-12-04 DIAGNOSIS — Z0000 Encounter for general adult medical examination without abnormal findings: Secondary | ICD-10-CM | POA: Diagnosis not present

## 2019-12-04 DIAGNOSIS — Z8639 Personal history of other endocrine, nutritional and metabolic disease: Secondary | ICD-10-CM

## 2019-12-04 DIAGNOSIS — F3289 Other specified depressive episodes: Secondary | ICD-10-CM

## 2019-12-04 DIAGNOSIS — R7302 Impaired glucose tolerance (oral): Secondary | ICD-10-CM | POA: Diagnosis not present

## 2019-12-04 DIAGNOSIS — E282 Polycystic ovarian syndrome: Secondary | ICD-10-CM | POA: Diagnosis not present

## 2019-12-04 NOTE — Addendum Note (Signed)
Addended by: Gregery Na on: 12/04/2019 09:15 AM   Modules accepted: Orders

## 2019-12-06 LAB — HEMOGLOBIN A1C
Hgb A1c MFr Bld: 5.7 % of total Hgb — ABNORMAL HIGH (ref <5.7)
Mean Plasma Glucose: 117 (calc)
eAG (mmol/L): 6.5 (calc)

## 2019-12-06 LAB — CBC WITH DIFFERENTIAL/PLATELET
Absolute Monocytes: 447 cells/uL (ref 200–950)
Basophils Absolute: 28 cells/uL (ref 0–200)
Basophils Relative: 0.4 %
Eosinophils Absolute: 92 cells/uL (ref 15–500)
Eosinophils Relative: 1.3 %
HCT: 37.1 % (ref 35.0–45.0)
Hemoglobin: 12.4 g/dL (ref 11.7–15.5)
Lymphs Abs: 2244 cells/uL (ref 850–3900)
MCH: 30 pg (ref 27.0–33.0)
MCHC: 33.4 g/dL (ref 32.0–36.0)
MCV: 89.8 fL (ref 80.0–100.0)
MPV: 11.2 fL (ref 7.5–12.5)
Monocytes Relative: 6.3 %
Neutro Abs: 4288 cells/uL (ref 1500–7800)
Neutrophils Relative %: 60.4 %
Platelets: 304 10*3/uL (ref 140–400)
RBC: 4.13 10*6/uL (ref 3.80–5.10)
RDW: 13.7 % (ref 11.0–15.0)
Total Lymphocyte: 31.6 %
WBC: 7.1 10*3/uL (ref 3.8–10.8)

## 2019-12-06 LAB — COMPLETE METABOLIC PANEL WITH GFR
AG Ratio: 1.6 (calc) (ref 1.0–2.5)
ALT: 18 U/L (ref 6–29)
AST: 17 U/L (ref 10–30)
Albumin: 4.2 g/dL (ref 3.6–5.1)
Alkaline phosphatase (APISO): 58 U/L (ref 31–125)
BUN: 11 mg/dL (ref 7–25)
CO2: 19 mmol/L — ABNORMAL LOW (ref 20–32)
Calcium: 9.4 mg/dL (ref 8.6–10.2)
Chloride: 107 mmol/L (ref 98–110)
Creat: 0.93 mg/dL (ref 0.50–1.10)
GFR, Est African American: 93 mL/min/{1.73_m2} (ref >=60)
GFR, Est Non African American: 80 mL/min/{1.73_m2} (ref >=60)
Globulin: 2.7 g/dL (calc) (ref 1.9–3.7)
Glucose, Bld: 93 mg/dL (ref 65–99)
Potassium: 4.5 mmol/L (ref 3.5–5.3)
Sodium: 143 mmol/L (ref 135–146)
Total Bilirubin: 0.3 mg/dL (ref 0.2–1.2)
Total Protein: 6.9 g/dL (ref 6.1–8.1)

## 2019-12-06 LAB — TSH: TSH: 1.91 mIU/L

## 2019-12-06 LAB — LIPID PANEL
Cholesterol: 246 mg/dL — ABNORMAL HIGH (ref <200)
HDL: 40 mg/dL — ABNORMAL LOW (ref >=50)
LDL Cholesterol (Calc): 168 mg/dL (calc) — ABNORMAL HIGH
Non-HDL Cholesterol (Calc): 206 mg/dL (calc) — ABNORMAL HIGH (ref <130)
Total CHOL/HDL Ratio: 6.2 (calc) — ABNORMAL HIGH (ref <5.0)
Triglycerides: 214 mg/dL — ABNORMAL HIGH (ref <150)

## 2019-12-08 ENCOUNTER — Encounter: Payer: Self-pay | Admitting: Internal Medicine

## 2019-12-08 ENCOUNTER — Ambulatory Visit: Payer: BC Managed Care – PPO | Admitting: Internal Medicine

## 2019-12-08 ENCOUNTER — Other Ambulatory Visit: Payer: Self-pay

## 2019-12-08 VITALS — BP 128/80 | HR 93 | Wt 280.0 lb

## 2019-12-08 DIAGNOSIS — F419 Anxiety disorder, unspecified: Secondary | ICD-10-CM

## 2019-12-08 DIAGNOSIS — F32A Depression, unspecified: Secondary | ICD-10-CM

## 2019-12-08 DIAGNOSIS — E039 Hypothyroidism, unspecified: Secondary | ICD-10-CM

## 2019-12-08 DIAGNOSIS — Z0000 Encounter for general adult medical examination without abnormal findings: Secondary | ICD-10-CM

## 2019-12-08 DIAGNOSIS — F329 Major depressive disorder, single episode, unspecified: Secondary | ICD-10-CM

## 2019-12-08 DIAGNOSIS — R7302 Impaired glucose tolerance (oral): Secondary | ICD-10-CM

## 2019-12-08 DIAGNOSIS — Z8669 Personal history of other diseases of the nervous system and sense organs: Secondary | ICD-10-CM | POA: Diagnosis not present

## 2019-12-08 DIAGNOSIS — M549 Dorsalgia, unspecified: Secondary | ICD-10-CM | POA: Diagnosis not present

## 2019-12-08 DIAGNOSIS — L410 Pityriasis lichenoides et varioliformis acuta: Secondary | ICD-10-CM

## 2019-12-08 DIAGNOSIS — E282 Polycystic ovarian syndrome: Secondary | ICD-10-CM

## 2019-12-08 LAB — POCT URINALYSIS DIPSTICK
Appearance: NEGATIVE
Bilirubin, UA: NEGATIVE
Blood, UA: NEGATIVE
Glucose, UA: NEGATIVE
Ketones, UA: NEGATIVE
Leukocytes, UA: NEGATIVE
Nitrite, UA: NEGATIVE
Odor: NEGATIVE
Protein, UA: NEGATIVE
Spec Grav, UA: 1.015 (ref 1.010–1.025)
Urobilinogen, UA: 0.2 E.U./dL
pH, UA: 6.5 (ref 5.0–8.0)

## 2019-12-08 MED ORDER — CYCLOBENZAPRINE HCL 10 MG PO TABS
10.0000 mg | ORAL_TABLET | Freq: Every day | ORAL | 2 refills | Status: DC
Start: 2019-12-08 — End: 2021-03-21

## 2019-12-08 NOTE — Progress Notes (Signed)
   Subjective:    Patient ID: Dawn Navarro, female    DOB: [birth year], 34 y.o.   MRN: [id number]  HPI 34 year old Female for health maintenance exam and evaluation of medical issues.   Since the pandemic and mask wearing, has not had frequent respiratory infections.   Unfortunately ran out of lipid medication before lab appt and had issues getting a refill with pharmacy. Therefore, lipids are elevated .  History of hypothyroidism, hyperlipidemia, migraine headaches, depression, morbid obesity.  History of polycystic ovary syndrome.  History of PLEVA treated by Dr. Charlton Haws, Dermatologist.  Have recommended Dr. Francena Hanly clinic the patient in the past.  History of allergic rhinitis and recurrent ear infections.  She took Clomid to get pregnant for the first time.  First child was delivered February 2012 via C-section for failure to progress.  Has been on thyroid replacement medication since 2010.  History of frequent respiratory infections but this subsided with wearing a mask during COVID-19 pandemic.  [facility] does GYN care.  She has a Mirena device.  In March 2012, she had Pap smear with ASCUS.  History of vitamin D deficiency  Social history: She is married.  She has a Education officer, community.  She works as an Scientist, water quality for [employer].  She does not smoke or consume alcohol.  Has 2 children.  Resides in [place].  Family history: Mother with history of hypothyroidism.  Father with history of non-Hodgkin's lymphoma and remote history of prostate cancer.  2 brothers in good health.  Both parents with hyperlipidemia.        Review of Systems  Constitutional: Negative.   Respiratory: Negative.   Cardiovascular: Negative.   Gastrointestinal: Negative.   Genitourinary: Negative.   Neurological:       History of migraine headaches       Objective:   Physical Exam Genitourinary:    Comments: Deferred to GYN  Blood pressure 128/80 BMI 43.85  pulse 93 pulse oximetry 97% weight 280 pounds.  Skin warm and dry.  Nodes none.  Neck is supple without thyromegaly.  Chest clear to auscultation.  Cardiac exam regular rate and rhythm normal S1 and S2.  Breasts normal female without masses.  Abdomen soft nondistended without hepatosplenomegaly masses or tenderness.  Abdomen is obese.  Neuro: Intact without focal deficits.  No lower extremity pitting edema.        Assessment & Plan:   Mixed hyperlipidemia- ran out of medication. Recheck in 6 months.  Total cholesterol off medication is 246, HDL is low at 40, triglycerides 214, LDL cholesterol 168.  Impaired glucose tolerance-hemoglobin A1c 5.7% and stable  History of hypothyroidism-TSH is normal on thyroid replacement medication.  Morbid obesity-needs to be addressed.  Patient has been reluctant to seek treatment.  She would be a candidate for gastric bypass surgery if she does not want to participate in weight loss program.  BMI 43.85  History of anxiety and depression treated with SSRI  History of migraine headaches treated with Fioricet sparingly  History of PLEVA seen by dermatologist  Musculoskeletal pain upper back on right side-recommend conservative treatment  Plan: Refill lipid-lowering medication and follow-up in 6 months.  Continue same dose of thyroid replacement.  Watch diet and get more exercise.

## 2019-12-16 ENCOUNTER — Telehealth: Payer: Self-pay | Admitting: Internal Medicine

## 2019-12-16 ENCOUNTER — Encounter: Payer: Self-pay | Admitting: Internal Medicine

## 2019-12-16 MED ORDER — SERTRALINE HCL 50 MG PO TABS
50.0000 mg | ORAL_TABLET | Freq: Every day | ORAL | 1 refills | Status: DC
Start: 2019-12-16 — End: 2020-04-13

## 2019-12-16 NOTE — Telephone Encounter (Signed)
Patient called at 6 pm [facility] told her they had faxed refill for Zoloft here. We have not received it. She needs refill. E-scribe to pharmacy requested

## 2020-01-05 NOTE — Patient Instructions (Signed)
Please have lipid medication refilled and take it regularly.  Follow-up here in 6 months.  Continue same dose of thyroid replacement.  Continue to watch diet with regard to glucose intolerance.  Consider options for treatment of morbid obesity including Dr. Francena Hanly clinic for gastric bypass surgery.

## 2020-02-25 ENCOUNTER — Telehealth: Payer: Self-pay | Admitting: Internal Medicine

## 2020-02-25 MED ORDER — METFORMIN HCL 500 MG PO TABS: ORAL_TABLET | ORAL | 1 refills | Status: DC

## 2020-02-25 MED ORDER — LEVOTHYROXINE SODIUM 100 MCG PO TABS: 100.0000 ug | ORAL_TABLET | Freq: Every day | ORAL | 1 refills | Status: DC

## 2020-02-25 NOTE — Telephone Encounter (Signed)
Dawn Navarro [phone]  [name] called to say she is changing pharmacy and she is completely out of 2 of her medicines and needs refills sent to new pharmacy listed below.  levothyroxine (SYNTHROID) 100 MCG tablet metFORMIN (GLUCOPHAGE) 500 MG tablet  New Pharmacy is  [facility] on [address]

## 2020-02-25 NOTE — Telephone Encounter (Signed)
Had her CPE on 5/21 and is scheduled to come back for a 6 month follow up in November.

## 2020-02-25 NOTE — Telephone Encounter (Signed)
Refill through November 

## 2020-04-12 ENCOUNTER — Other Ambulatory Visit: Payer: Self-pay | Admitting: Internal Medicine

## 2020-05-17 DIAGNOSIS — S92902B Unspecified fracture of left foot, initial encounter for open fracture: Secondary | ICD-10-CM | POA: Diagnosis not present

## 2020-05-17 DIAGNOSIS — S99922A Unspecified injury of left foot, initial encounter: Secondary | ICD-10-CM | POA: Diagnosis not present

## 2020-05-17 DIAGNOSIS — R936 Abnormal findings on diagnostic imaging of limbs: Secondary | ICD-10-CM | POA: Diagnosis not present

## 2020-05-17 DIAGNOSIS — S93402A Sprain of unspecified ligament of left ankle, initial encounter: Secondary | ICD-10-CM | POA: Diagnosis not present

## 2020-05-17 DIAGNOSIS — X58XXXA Exposure to other specified factors, initial encounter: Secondary | ICD-10-CM | POA: Diagnosis not present

## 2020-05-17 DIAGNOSIS — S8990XA Unspecified injury of unspecified lower leg, initial encounter: Secondary | ICD-10-CM | POA: Diagnosis not present

## 2020-05-19 DIAGNOSIS — S93492A Sprain of other ligament of left ankle, initial encounter: Secondary | ICD-10-CM | POA: Diagnosis not present

## 2020-05-19 DIAGNOSIS — S92345A Nondisplaced fracture of fourth metatarsal bone, left foot, initial encounter for closed fracture: Secondary | ICD-10-CM | POA: Diagnosis not present

## 2020-05-19 DIAGNOSIS — S92325A Nondisplaced fracture of second metatarsal bone, left foot, initial encounter for closed fracture: Secondary | ICD-10-CM | POA: Diagnosis not present

## 2020-05-19 DIAGNOSIS — S92335A Nondisplaced fracture of third metatarsal bone, left foot, initial encounter for closed fracture: Secondary | ICD-10-CM | POA: Diagnosis not present

## 2020-06-10 ENCOUNTER — Other Ambulatory Visit: Payer: BLUE CROSS/BLUE SHIELD | Admitting: Internal Medicine

## 2020-06-10 ENCOUNTER — Other Ambulatory Visit: Payer: Self-pay

## 2020-06-10 DIAGNOSIS — E039 Hypothyroidism, unspecified: Secondary | ICD-10-CM | POA: Diagnosis not present

## 2020-06-10 DIAGNOSIS — E282 Polycystic ovarian syndrome: Secondary | ICD-10-CM | POA: Diagnosis not present

## 2020-06-10 DIAGNOSIS — R7302 Impaired glucose tolerance (oral): Secondary | ICD-10-CM

## 2020-06-10 DIAGNOSIS — S92901A Unspecified fracture of right foot, initial encounter for closed fracture: Secondary | ICD-10-CM | POA: Diagnosis not present

## 2020-06-11 ENCOUNTER — Ambulatory Visit (INDEPENDENT_AMBULATORY_CARE_PROVIDER_SITE_OTHER): Payer: BLUE CROSS/BLUE SHIELD | Admitting: Internal Medicine

## 2020-06-11 ENCOUNTER — Encounter: Payer: Self-pay | Admitting: Internal Medicine

## 2020-06-11 VITALS — BP 120/80 | HR 100 | Ht 68.0 in | Wt 297.0 lb

## 2020-06-11 DIAGNOSIS — Z23 Encounter for immunization: Secondary | ICD-10-CM

## 2020-06-11 DIAGNOSIS — Z6841 Body Mass Index (BMI) 40.0 and over, adult: Secondary | ICD-10-CM

## 2020-06-11 DIAGNOSIS — E559 Vitamin D deficiency, unspecified: Secondary | ICD-10-CM | POA: Diagnosis not present

## 2020-06-11 DIAGNOSIS — E785 Hyperlipidemia, unspecified: Secondary | ICD-10-CM

## 2020-06-11 DIAGNOSIS — S92901A Unspecified fracture of right foot, initial encounter for closed fracture: Secondary | ICD-10-CM

## 2020-06-11 MED ORDER — ATORVASTATIN CALCIUM 40 MG PO TABS: 40.0000 mg | ORAL_TABLET | Freq: Every day | ORAL | 3 refills | Status: DC

## 2020-06-11 MED ORDER — METFORMIN HCL 500 MG PO TABS: 500.0000 mg | ORAL_TABLET | Freq: Two times a day (BID) | ORAL | 3 refills | Status: DC

## 2020-06-11 NOTE — Progress Notes (Signed)
   Subjective:    Patient ID: Dawn Navarro, female    DOB: Sep 11, 1985, 34 y.o.   MRN: [id number]  HPI  34 year olf Female for 6 month recheck. No longer working at [employer]. 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 Likes her job. No longer has to commute to [place].  She has a history of hypothyroidism, hyperlipidemia and impaired glucose tolerance.  Unfortunately, suffered a closed fracture of right foot. She has a walking cast and is followed by orthopedist. Checked her vitamin D level today and it is low normal at 38. Recommend 5000 units vitamin D3 daily. TSH is normal at 1.12 on thyroid replacement medication, hemoglobin A1c 5.8%, triglycerides are elevated at 199 and in April were 214. Total cholesterol is normal and LDL cholesterol is normal. HDL is low at 43. It is plausible that if she were more active her triglycerides would be improved. Has been on atorvastatin 20 mg daily but will increase to 40 mg daily and she will follow-up in May at which time she is due for health maintenance exam and fasting labs.  She has had 1 Covid J&J vaccine. Influenza vaccine given today. Tetanus immunization update is due next year.  Review of Systems     Objective:   Physical Exam Blood pressure 120/80 pulse 100 pulse oximetry 97% weight 297 pounds BMI 45.16 Skin warm and dry. No thyromegaly. No carotid bruits. Chest clear to auscultation. Cardiac exam regular rate and rhythm normal S1 and S2      Assessment & Plan:  Right foot injury-is in walking cast  Low normal vitamin D level-take 5000 units daily  Health maintenance-flu vaccine given  BMI 45.16-unable to exercise at this point time due to foot injury  Hyperlipidemia-mainly hypertriglyceridemia increase atorvastatin 40 mg daily and follow-up in the at time of health maintenance exam in May 2022  History of depression treated with Zoloft  Hypothyroidism treated with levothyroxine 100 mcg daily and TSH is  stable  History of impaired glucose tolerance today she has been watching her carbohydrate intake and hemoglobin A1c stable at 5.8%  Plan: Continue current medications but have increased atorvastatin from 20 to 40 mg daily. Follow-up with health maintenance exam in May 2022. Once you are out of walking boot, hopefully you will be able to exercise more continue to watch diet and restrict calories as you are not as active. Continue same dose of thyroid replacement.

## 2020-06-12 LAB — HEMOGLOBIN A1C
Hgb A1c MFr Bld: 5.8 % of total Hgb — ABNORMAL HIGH (ref <5.7)
Mean Plasma Glucose: 120 (calc)
eAG (mmol/L): 6.6 (calc)

## 2020-06-12 LAB — LIPID PANEL
Cholesterol: 168 mg/dL (ref <200)
HDL: 43 mg/dL — ABNORMAL LOW (ref >=50)
LDL Cholesterol (Calc): 95 mg/dL (calc)
Non-HDL Cholesterol (Calc): 125 mg/dL (calc) (ref <130)
Total CHOL/HDL Ratio: 3.9 (calc) (ref <5.0)
Triglycerides: 199 mg/dL — ABNORMAL HIGH (ref <150)

## 2020-06-12 LAB — HEPATIC FUNCTION PANEL
AG Ratio: 1.7 (calc) (ref 1.0–2.5)
ALT: 24 U/L (ref 6–29)
AST: 19 U/L (ref 10–30)
Albumin: 4.3 g/dL (ref 3.6–5.1)
Alkaline phosphatase (APISO): 62 U/L (ref 31–125)
Bilirubin, Direct: 0.1 mg/dL (ref 0.0–0.2)
Globulin: 2.5 g/dL (calc) (ref 1.9–3.7)
Indirect Bilirubin: 0.4 mg/dL (calc) (ref 0.2–1.2)
Total Bilirubin: 0.5 mg/dL (ref 0.2–1.2)
Total Protein: 6.8 g/dL (ref 6.1–8.1)

## 2020-06-12 LAB — TEST AUTHORIZATION

## 2020-06-12 LAB — VITAMIN D 25 HYDROXY (VIT D DEFICIENCY, FRACTURES): Vit D, 25-Hydroxy: 38 ng/mL (ref 30–100)

## 2020-06-12 LAB — TSH: TSH: 1.12 mIU/L

## 2020-06-14 ENCOUNTER — Telehealth: Payer: Self-pay | Admitting: Internal Medicine

## 2020-06-14 NOTE — Telephone Encounter (Signed)
LVM to CB, wanted to let her know I have mailed the completed form to her for her signature and to make sure she is taking her Vit D to heel fractures 5,000 units daily.

## 2020-06-29 DIAGNOSIS — S93492A Sprain of other ligament of left ankle, initial encounter: Secondary | ICD-10-CM | POA: Diagnosis not present

## 2020-06-29 DIAGNOSIS — S92335A Nondisplaced fracture of third metatarsal bone, left foot, initial encounter for closed fracture: Secondary | ICD-10-CM | POA: Diagnosis not present

## 2020-06-29 DIAGNOSIS — S92325A Nondisplaced fracture of second metatarsal bone, left foot, initial encounter for closed fracture: Secondary | ICD-10-CM | POA: Diagnosis not present

## 2020-06-29 DIAGNOSIS — S92345A Nondisplaced fracture of fourth metatarsal bone, left foot, initial encounter for closed fracture: Secondary | ICD-10-CM | POA: Diagnosis not present

## 2020-07-04 NOTE — Patient Instructions (Signed)
And herTry to restrict calories until you are out of walking boot to walk again for exercise. Increase atorvastatin from 20 to 40 mg daily. Flu vaccine given today. TSH is normal on current dose of thyroid replacement. Watch blood pressure at home. Take 5000 units vitamin D3 over-the-counter daily. Continue Zoloft. Physical exam scheduled for May 2022.

## 2020-07-15 DIAGNOSIS — Z03818 Encounter for observation for suspected exposure to other biological agents ruled out: Secondary | ICD-10-CM | POA: Diagnosis not present

## 2020-08-09 DIAGNOSIS — S92335A Nondisplaced fracture of third metatarsal bone, left foot, initial encounter for closed fracture: Secondary | ICD-10-CM | POA: Diagnosis not present

## 2020-08-09 DIAGNOSIS — S92345A Nondisplaced fracture of fourth metatarsal bone, left foot, initial encounter for closed fracture: Secondary | ICD-10-CM | POA: Diagnosis not present

## 2020-08-09 DIAGNOSIS — S92325A Nondisplaced fracture of second metatarsal bone, left foot, initial encounter for closed fracture: Secondary | ICD-10-CM | POA: Diagnosis not present

## 2020-08-09 DIAGNOSIS — S93492A Sprain of other ligament of left ankle, initial encounter: Secondary | ICD-10-CM | POA: Diagnosis not present

## 2020-08-15 DIAGNOSIS — M5416 Radiculopathy, lumbar region: Secondary | ICD-10-CM | POA: Diagnosis not present

## 2020-08-17 ENCOUNTER — Telehealth: Payer: Self-pay | Admitting: Internal Medicine

## 2020-08-17 ENCOUNTER — Encounter: Payer: Self-pay | Admitting: Internal Medicine

## 2020-08-17 ENCOUNTER — Other Ambulatory Visit: Payer: Self-pay

## 2020-08-17 ENCOUNTER — Telehealth (INDEPENDENT_AMBULATORY_CARE_PROVIDER_SITE_OTHER): Payer: BLUE CROSS/BLUE SHIELD | Admitting: Internal Medicine

## 2020-08-17 VITALS — Temp 98.2°F

## 2020-08-17 DIAGNOSIS — B3731 Acute candidiasis of vulva and vagina: Secondary | ICD-10-CM

## 2020-08-17 DIAGNOSIS — B373 Candidiasis of vulva and vagina: Secondary | ICD-10-CM | POA: Diagnosis not present

## 2020-08-17 DIAGNOSIS — M5430 Sciatica, unspecified side: Secondary | ICD-10-CM

## 2020-08-17 DIAGNOSIS — R7302 Impaired glucose tolerance (oral): Secondary | ICD-10-CM | POA: Diagnosis not present

## 2020-08-17 MED ORDER — FLUCONAZOLE 150 MG PO TABS: 150.0000 mg | ORAL_TABLET | Freq: Once | ORAL | 0 refills | Status: AC

## 2020-08-17 NOTE — Telephone Encounter (Signed)
Scheduled

## 2020-08-17 NOTE — Patient Instructions (Signed)
Take Diflucan 150 mg tablet today and may repeat in 5 days if not improving.  I think steroids that were prescribed for sciatica are causing issues with already diagnosed impaired glucose tolerance making you more susceptible to Candida infection.

## 2020-08-17 NOTE — Telephone Encounter (Signed)
How about a virtual visit. Do not see that we have treated her for this in sometime,

## 2020-08-17 NOTE — Progress Notes (Signed)
   Subjective:    Patient ID: Dawn Navarro, female    DOB: [birth year], 35 y.o.   MRN: [id number]  HPI 35 year old  Female seen today via interactive audio and video telecommunications due to the Coronavirus pandemic.  She is identified using 2 identifiers as Dawn Navarro. Dawn Navarro, a patient in this practice.  She resides in [facility].  She is agreeable to visit in this format today.  She is at her home and I am in my office.  Patient says that she went to an urgent care near her home on Sunday, January 9 with complaint of back pain.  Was diagnosed with sciatica and was given Toradol IM and started on prednisone 40 mg once daily for 7 days.  Subsequently has developed vaginal symptoms including itching and burning onset yesterday with white vaginal discharge.  Thinks she may have a vaginal yeast infection.  Patient was seen in May for health maintenance exam.  She has a history of hypothyroidism, hyperlipidemia, migraine headaches, depression, morbid obesity, polycystic ovary syndrome and history of poorly treated by dermatologist.  History of allergic rhinitis and recurrent ear infections.  History of vitamin D deficiency.  History of impaired glucose tolerance.  Hemoglobin A1c in November was 5.8%.  She is on metformin for glucose intolerance.    Review of Systems no other complaints     Objective:   Physical Exam Reports that she is afebrile.  Reports that vaginal discharge is white.  Symptoms started yesterday and consist of itching and burning.       Assessment & Plan:  Explained to patient that based on interview in virtual visit  that her symptoms sound like a Candida yeast infection exacerbated by treatment with steroids for sciatica.  She also has a history of impaired glucose tolerance.  Plan: I am recommending Diflucan 150 mg tablet to take today.  May repeat Diflucan one-time dose 150 mg if not improving in 5 days.  Time spent with patient on the phone, time spent  reviewing her medical record, E prescribing Diflucan to pharmacy is 17 minutes.

## 2020-08-17 NOTE — Telephone Encounter (Signed)
[name] [phone]  Mikella called to say she feels a vaginal infection coming on, she is having a burning sensation and some discharge.  She is wandering if there is anyway you could call something in, since she gets these often.

## 2020-08-20 ENCOUNTER — Other Ambulatory Visit: Payer: Self-pay | Admitting: Internal Medicine

## 2020-12-07 ENCOUNTER — Other Ambulatory Visit: Payer: Self-pay

## 2020-12-07 ENCOUNTER — Other Ambulatory Visit (INDEPENDENT_AMBULATORY_CARE_PROVIDER_SITE_OTHER): Payer: BLUE CROSS/BLUE SHIELD | Admitting: Internal Medicine

## 2020-12-07 DIAGNOSIS — F32A Depression, unspecified: Secondary | ICD-10-CM

## 2020-12-07 DIAGNOSIS — Z0000 Encounter for general adult medical examination without abnormal findings: Secondary | ICD-10-CM

## 2020-12-07 DIAGNOSIS — F419 Anxiety disorder, unspecified: Secondary | ICD-10-CM

## 2020-12-07 DIAGNOSIS — L410 Pityriasis lichenoides et varioliformis acuta: Secondary | ICD-10-CM

## 2020-12-07 DIAGNOSIS — Z8669 Personal history of other diseases of the nervous system and sense organs: Secondary | ICD-10-CM

## 2020-12-07 DIAGNOSIS — E039 Hypothyroidism, unspecified: Secondary | ICD-10-CM

## 2020-12-07 DIAGNOSIS — E282 Polycystic ovarian syndrome: Secondary | ICD-10-CM

## 2020-12-07 DIAGNOSIS — R7302 Impaired glucose tolerance (oral): Secondary | ICD-10-CM

## 2020-12-09 ENCOUNTER — Other Ambulatory Visit: Payer: Self-pay

## 2020-12-09 ENCOUNTER — Encounter: Payer: Self-pay | Admitting: Internal Medicine

## 2020-12-09 ENCOUNTER — Ambulatory Visit (INDEPENDENT_AMBULATORY_CARE_PROVIDER_SITE_OTHER): Payer: BLUE CROSS/BLUE SHIELD | Admitting: Internal Medicine

## 2020-12-09 VITALS — BP 130/90 | HR 91 | Ht 68.0 in | Wt 286.0 lb

## 2020-12-09 DIAGNOSIS — Z0000 Encounter for general adult medical examination without abnormal findings: Secondary | ICD-10-CM | POA: Diagnosis not present

## 2020-12-09 DIAGNOSIS — R7302 Impaired glucose tolerance (oral): Secondary | ICD-10-CM

## 2020-12-09 DIAGNOSIS — Z8669 Personal history of other diseases of the nervous system and sense organs: Secondary | ICD-10-CM

## 2020-12-09 DIAGNOSIS — L410 Pityriasis lichenoides et varioliformis acuta: Secondary | ICD-10-CM | POA: Diagnosis not present

## 2020-12-09 DIAGNOSIS — Z23 Encounter for immunization: Secondary | ICD-10-CM

## 2020-12-09 DIAGNOSIS — E782 Mixed hyperlipidemia: Secondary | ICD-10-CM

## 2020-12-09 DIAGNOSIS — E039 Hypothyroidism, unspecified: Secondary | ICD-10-CM | POA: Diagnosis not present

## 2020-12-09 DIAGNOSIS — F32A Depression, unspecified: Secondary | ICD-10-CM

## 2020-12-09 DIAGNOSIS — F419 Anxiety disorder, unspecified: Secondary | ICD-10-CM

## 2020-12-09 LAB — COMPLETE METABOLIC PANEL WITH GFR
AG Ratio: 2 (calc) (ref 1.0–2.5)
ALT: 21 U/L (ref 6–29)
AST: 22 U/L (ref 10–30)
Albumin: 4.7 g/dL (ref 3.6–5.1)
Alkaline phosphatase (APISO): 75 U/L (ref 31–125)
BUN: 14 mg/dL (ref 7–25)
CO2: 28 mmol/L (ref 20–32)
Calcium: 9.8 mg/dL (ref 8.6–10.2)
Chloride: 106 mmol/L (ref 98–110)
Creat: 0.8 mg/dL (ref 0.50–1.10)
GFR, Est African American: 111 mL/min/{1.73_m2} (ref >=60)
GFR, Est Non African American: 96 mL/min/{1.73_m2} (ref >=60)
Globulin: 2.4 g/dL (calc) (ref 1.9–3.7)
Glucose, Bld: 96 mg/dL (ref 65–99)
Potassium: 4.7 mmol/L (ref 3.5–5.3)
Sodium: 143 mmol/L (ref 135–146)
Total Bilirubin: 0.7 mg/dL (ref 0.2–1.2)
Total Protein: 7.1 g/dL (ref 6.1–8.1)

## 2020-12-09 LAB — POCT URINALYSIS DIPSTICK
Appearance: NEGATIVE
Bilirubin, UA: NEGATIVE
Blood, UA: NEGATIVE
Glucose, UA: NEGATIVE
Ketones, UA: NEGATIVE
Leukocytes, UA: NEGATIVE
Nitrite, UA: NEGATIVE
Odor: NEGATIVE
Protein, UA: NEGATIVE
Spec Grav, UA: 1.015 (ref 1.010–1.025)
Urobilinogen, UA: 0.2 E.U./dL
pH, UA: 6.5 (ref 5.0–8.0)

## 2020-12-09 LAB — HEMOGLOBIN A1C

## 2020-12-09 LAB — LIPID PANEL
Cholesterol: 160 mg/dL (ref <200)
HDL: 57 mg/dL (ref >=50)
LDL Cholesterol (Calc): 83 mg/dL (calc)
Non-HDL Cholesterol (Calc): 103 mg/dL (calc) (ref <130)
Total CHOL/HDL Ratio: 2.8 (calc) (ref <5.0)
Triglycerides: 104 mg/dL (ref <150)

## 2020-12-09 LAB — VITAMIN D 25 HYDROXY (VIT D DEFICIENCY, FRACTURES): Vit D, 25-Hydroxy: 31 ng/mL (ref 30–100)

## 2020-12-09 LAB — CBC WITH DIFFERENTIAL/PLATELET

## 2020-12-09 LAB — TSH: TSH: 2.92 mIU/L

## 2020-12-09 NOTE — Progress Notes (Signed)
Subjective:    Patient ID: Dawn Navarro, female    DOB: [birth year], 35 y.o.   MRN: [id number]  HPI 35 year old Female seen for health maintenance exam and evaluation of medical issues.  She now works from home and has less frequent respiratory infections.  Also mask wearing has helped.  She has a history of hypothyroidism, hyperlipidemia, migraine headaches, depression, morbid obesity, history of polycystic ovary syndrome.  History of cleaver treated by Dr. Charlton Haws, dermatologist.  I recommended Dr[facility] for her in the past.  History of allergic rhinitis and recurrent ear infections.  Has been on thyroid replacement medication since 2010.  [facility] does GYN care.  She has a Mirena device.  In March 2012 she had a Pap smear with ASCUS.  She took Clomid to get pregnant for the first time.  First child was delivered February 2012 by C-section for failure to progress.  History of vitamin D deficiency.  Social history: She is married.  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  She is able to work from home.  She has 2 children.  She lives in [place].  She does not smoke or consume alcohol.  Family history: Mother with history of hypothyroidism.  Father with history of non-Hodgkin's lymphoma and remote history of prostate cancer.  2 brothers in good health.  Both parents with hyperlipidemia.  Mother has been diagnosed with atrial fibrillation.   Review of Systems  Constitutional: Negative.   Respiratory: Negative.   Cardiovascular: Negative.   Gastrointestinal: Negative.   Genitourinary: Negative.   Neurological: Negative.   Psychiatric/Behavioral: Negative.        Objective:   Physical Exam Vitals reviewed.  Constitutional:      Appearance: Normal appearance.  HENT:     Head: Normocephalic.     Right Ear: Tympanic membrane normal.     Left Ear: Tympanic membrane normal.     Nose: Nose normal.  Eyes:     General: No  scleral icterus.       Right eye: No discharge.        Left eye: No discharge.     Extraocular Movements: Extraocular movements intact.     Pupils: Pupils are equal, round, and reactive to light.  Neck:     Vascular: No carotid bruit.     Comments: No thyromegaly Cardiovascular:     Rate and Rhythm: Normal rate and regular rhythm.     Heart sounds: Normal heart sounds. No murmur heard.   Pulmonary:     Effort: Pulmonary effort is normal.     Breath sounds: Normal breath sounds. No rales.  Abdominal:     General: Bowel sounds are normal.     Palpations: Abdomen is soft.     Tenderness: There is no abdominal tenderness. There is no guarding or rebound.  Genitourinary:    Comments: Deferred to GYN Musculoskeletal:     Cervical back: Neck supple. No rigidity.     Right lower leg: No edema.     Left lower leg: No edema.  Skin:    General: Skin is warm and dry.  Neurological:     General: No focal deficit present.     Mental Status: She is alert and oriented to person, place, and time.     Cranial Nerves: No cranial nerve deficit.     Sensory: No sensory deficit.     Motor: No weakness.     Coordination: Coordination normal.  Gait: Gait normal.  Psychiatric:        Mood and Affect: Mood normal.        Behavior: Behavior normal.        Thought Content: Thought content normal.        Judgment: Judgment normal.           Assessment & Plan:  BMI 43.49 (weight 286 pounds)  Has gained 6 pounds since May 2021.  I continue to be concerned about her weight.  Would recommend Dr. [facility] clinic.  She is a candidate for gastric bypass surgery.  History of migraine headaches  History of frequent respiratory infections but this is improved during the pandemic and working from home and mask wearing  History of PLEVA treated by dermatologist  History of anxiety and depression treated with SSRI  Hypothyroidism-TSH is normal on thyroid replacement medication  Impaired  glucose tolerance-fasting glucose is normal at 96.  For some reason hemoglobin A1c canceled  Vitamin D level is low normal at 31  Mixed hyperlipidemia-liver functions are normal and lipid panel is normal.  Plan: Some reason her hemoglobin A1c was canceled. She does not come to town very often.  She will try to stop by and have this done in the near future.  Otherwise, we will see her in November for 16-month follow-up with lipid panel, liver functions and hemoglobin A1c.  Tetanus immunization update given.

## 2020-12-10 LAB — TSH

## 2020-12-10 LAB — CBC WITH DIFFERENTIAL/PLATELET

## 2020-12-10 LAB — LIPID PANEL

## 2020-12-10 LAB — HEMOGLOBIN A1C

## 2020-12-10 LAB — COMPLETE METABOLIC PANEL WITH GFR

## 2020-12-12 ENCOUNTER — Other Ambulatory Visit: Payer: Self-pay | Admitting: Internal Medicine

## 2021-01-03 NOTE — Patient Instructions (Signed)
For some reason hemoglobin A1c was canceled by lab.  If you are in town come by we will draw that in the near future.  Otherwise we will see you in November for 44-month follow-up with fasting lipid panel liver functions and hemoglobin A1c.  Tetanus immunization update given.  Consider Dr. [facility]ly clinic for weight loss and or gastric bypass surgery.  Weight has increased 6 pounds since 2021.

## 2021-02-18 ENCOUNTER — Other Ambulatory Visit: Payer: Self-pay | Admitting: Internal Medicine

## 2021-03-04 DIAGNOSIS — Z6841 Body Mass Index (BMI) 40.0 and over, adult: Secondary | ICD-10-CM | POA: Diagnosis not present

## 2021-03-04 DIAGNOSIS — Z01419 Encounter for gynecological examination (general) (routine) without abnormal findings: Secondary | ICD-10-CM | POA: Diagnosis not present

## 2021-03-21 ENCOUNTER — Other Ambulatory Visit: Payer: Self-pay

## 2021-03-21 ENCOUNTER — Telehealth (INDEPENDENT_AMBULATORY_CARE_PROVIDER_SITE_OTHER): Payer: BLUE CROSS/BLUE SHIELD | Admitting: Internal Medicine

## 2021-03-21 ENCOUNTER — Telehealth: Payer: Self-pay | Admitting: Internal Medicine

## 2021-03-21 ENCOUNTER — Encounter: Payer: Self-pay | Admitting: Internal Medicine

## 2021-03-21 VITALS — Temp 98.4°F

## 2021-03-21 DIAGNOSIS — J4520 Mild intermittent asthma, uncomplicated: Secondary | ICD-10-CM

## 2021-03-21 DIAGNOSIS — M791 Myalgia, unspecified site: Secondary | ICD-10-CM

## 2021-03-21 DIAGNOSIS — R7302 Impaired glucose tolerance (oral): Secondary | ICD-10-CM | POA: Diagnosis not present

## 2021-03-21 DIAGNOSIS — E782 Mixed hyperlipidemia: Secondary | ICD-10-CM | POA: Diagnosis not present

## 2021-03-21 DIAGNOSIS — E039 Hypothyroidism, unspecified: Secondary | ICD-10-CM

## 2021-03-21 DIAGNOSIS — U071 COVID-19: Secondary | ICD-10-CM

## 2021-03-21 DIAGNOSIS — R0981 Nasal congestion: Secondary | ICD-10-CM

## 2021-03-21 MED ORDER — NIRMATRELVIR/RITONAVIR (PAXLOVID)TABLET: 3.0000 | ORAL_TABLET | Freq: Two times a day (BID) | ORAL | 0 refills | Status: AC

## 2021-03-21 MED ORDER — HYDROCODONE BIT-HOMATROP MBR 5-1.5 MG/5ML PO SOLN: 5.0000 mL | Freq: Three times a day (TID) | ORAL | 0 refills | Status: DC | PRN

## 2021-03-21 NOTE — Telephone Encounter (Signed)
Dawn Navarro  [phone]  [name] called to say she has just tested positive for COVID, she started yesterday with scratchy throat, has headache and some cough today. Okay to schedule Virtual visit this afternoon? J&J vaccine and a Pfizer Vaccine, no fever yet.

## 2021-03-21 NOTE — Patient Instructions (Signed)
Take Paxlovid as directed.  Estimated GFR is 96 cc/min based on labs in May.  Take Hycodan 1 teaspoon every 8 hours as needed for cough.  Rest and drink plenty of fluids.  Monitor pulse oximetry with a pulse ox device.  Walk to keep airways open.  Call if symptoms worsen or not improving.  Quarantine for 5 days.

## 2021-03-21 NOTE — Telephone Encounter (Signed)
scheduled

## 2021-03-21 NOTE — Progress Notes (Signed)
   Subjective:    Patient ID: Dawn Navarro, female    DOB: 09/17/1985, 35 y.o.   MRN: [id number]  HPI 35 year old Female seen via interactive audio and video telecommunications due to the Coronavirus pandemic.  Patient is identified using 2 identifiers as Dawn Navarro. Dawn Navarro, a patient in this practice.  Patient is at her home and I am at my office.  She is agreeable to visit in this format today.  Patient developed some sniffles over the weekend.  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  This morning she woke up coughing a lot.  Has had discolored sputum.  Reports no chills or nausea but has had diffuse myalgias.  Had headache last night.  She, her children and her husband have all tested positive for COVID-19 through home testing.  Children started school last week.  Husband may have been exposed at work.  Patient works from home and has not traveled.  Patient has a history of hypothyroidism, hyperlipidemia, migraine headaches, depression, morbid obesity and history of polycystic ovary syndrome.  History of allergic rhinitis and at times recurrent ear infections.  History of vitamin D deficiency.  She has a history of impaired glucose tolerance treated with metformin.  History of depression treated with Zoloft.  Social history: She is married.  She has 2 children who attend public school.  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  She lives in [place].  Does not smoke or consume alcohol.  My records indicate she has only had 2 COVID-19 immunizations, one in April 2021 and another one in November 2021.  She has a Mirena device.     Review of Systems reports no nausea, vomiting, shaking chills but says she is coughing a lot and is bringing up yellow sputum.     Objective:   Physical Exam Temperature is 98.4 degrees after Tylenol  Patient is seen virtually and appears a bit pale.  Respiratory rate appears to be normal.  She is not  hoarse at the present time.  She looks a bit fatigued.       Assessment & Plan:  Acute COVID-19 virus infection-positive home test and is symptomatic.  Has myalgias and nasal congestion.  Morbid obesity  Hypothyroidism on thyroid replacement medication  Hyperlipidemia-treated with statin medication  History of migraine headaches  History of allergic rhinitis  History of depression treated with Zoloft  History of reactive airways disease treated with albuterol inhaler on a as needed basis-currently not wheezing  Impaired glucose tolerance treated with metformin  Remote history of PLEVA-previously seen by Dermatologist  Plan:Paxlovid (Note: estimated GFR is 96 cc/min based on labs in May).  At her request, Hycodan 1 teaspoon every 8 hours as needed for cough.  Rest and drink plenty of fluids.  Monitor pulse oximetry with a pulse ox device and walk to keep airways open.  Call if symptoms worsen or not improving.  Quarantine for 5 days.

## 2021-03-28 ENCOUNTER — Telehealth: Payer: Self-pay | Admitting: Internal Medicine

## 2021-03-28 NOTE — Telephone Encounter (Signed)
Faxed Positive Home COVID results to [facility] [phone], phone [phone]  Positive 03/21/2021

## 2021-04-06 DIAGNOSIS — Z30431 Encounter for routine checking of intrauterine contraceptive device: Secondary | ICD-10-CM | POA: Diagnosis not present

## 2021-04-06 DIAGNOSIS — Z6841 Body Mass Index (BMI) 40.0 and over, adult: Secondary | ICD-10-CM | POA: Diagnosis not present

## 2021-05-06 DIAGNOSIS — Z30430 Encounter for insertion of intrauterine contraceptive device: Secondary | ICD-10-CM | POA: Diagnosis not present

## 2021-05-06 DIAGNOSIS — Z3046 Encounter for surveillance of implantable subdermal contraceptive: Secondary | ICD-10-CM | POA: Diagnosis not present

## 2021-05-06 DIAGNOSIS — Z30431 Encounter for routine checking of intrauterine contraceptive device: Secondary | ICD-10-CM | POA: Diagnosis not present

## 2021-06-06 ENCOUNTER — Other Ambulatory Visit: Payer: Self-pay | Admitting: Internal Medicine

## 2021-06-10 ENCOUNTER — Encounter: Payer: Self-pay | Admitting: Internal Medicine

## 2021-06-10 ENCOUNTER — Telehealth: Payer: Self-pay | Admitting: Internal Medicine

## 2021-06-10 ENCOUNTER — Other Ambulatory Visit: Payer: Self-pay

## 2021-06-10 ENCOUNTER — Other Ambulatory Visit: Payer: BLUE CROSS/BLUE SHIELD | Admitting: Internal Medicine

## 2021-06-10 ENCOUNTER — Ambulatory Visit (INDEPENDENT_AMBULATORY_CARE_PROVIDER_SITE_OTHER): Payer: BLUE CROSS/BLUE SHIELD | Admitting: Internal Medicine

## 2021-06-10 VITALS — BP 112/78 | HR 83 | Temp 98.3°F | Ht 68.0 in | Wt 280.0 lb

## 2021-06-10 DIAGNOSIS — R7302 Impaired glucose tolerance (oral): Secondary | ICD-10-CM

## 2021-06-10 DIAGNOSIS — R350 Frequency of micturition: Secondary | ICD-10-CM

## 2021-06-10 DIAGNOSIS — F32A Depression, unspecified: Secondary | ICD-10-CM | POA: Diagnosis not present

## 2021-06-10 DIAGNOSIS — F419 Anxiety disorder, unspecified: Secondary | ICD-10-CM | POA: Diagnosis not present

## 2021-06-10 DIAGNOSIS — E782 Mixed hyperlipidemia: Secondary | ICD-10-CM

## 2021-06-10 DIAGNOSIS — E039 Hypothyroidism, unspecified: Secondary | ICD-10-CM | POA: Diagnosis not present

## 2021-06-10 DIAGNOSIS — Z23 Encounter for immunization: Secondary | ICD-10-CM | POA: Diagnosis not present

## 2021-06-10 LAB — POCT URINALYSIS DIPSTICK
Bilirubin, UA: NEGATIVE
Blood, UA: NEGATIVE
Glucose, UA: NEGATIVE
Ketones, UA: NEGATIVE
Leukocytes, UA: NEGATIVE
Nitrite, UA: NEGATIVE
Protein, UA: NEGATIVE
Spec Grav, UA: 1.01 (ref 1.010–1.025)
Urobilinogen, UA: 0.2 E.U./dL
pH, UA: 7.5 (ref 5.0–8.0)

## 2021-06-10 MED ORDER — FLUCONAZOLE 150 MG PO TABS: 150.0000 mg | ORAL_TABLET | Freq: Once | ORAL | 0 refills | Status: AC

## 2021-06-10 MED ORDER — CIPROFLOXACIN HCL 500 MG PO TABS: ORAL_TABLET | ORAL | 0 refills | Status: DC

## 2021-06-10 MED ORDER — CIPROFLOXACIN HCL 500 MG PO TABS: 500.0000 mg | ORAL_TABLET | Freq: Two times a day (BID) | ORAL | 0 refills | Status: DC

## 2021-06-10 NOTE — Progress Notes (Signed)
Resent rx that didn't go through

## 2021-06-10 NOTE — Telephone Encounter (Signed)
Dawn Navarro [phone]  Serrina came for her blood work and said she was having frequent urination and wanted to leave a sample, so I schedule her for the 9:45 appointment.

## 2021-06-10 NOTE — Progress Notes (Signed)
anm

## 2021-06-10 NOTE — Progress Notes (Signed)
   Subjective:    Patient ID: Dawn Navarro, female    DOB: 04/29/1986, 35 y.o.   MRN: [id number]  HPI Onset urinary frequency associated with back pain recently. Here today for labs for upcoming CPE next week.  No fever or chills.  She has a history of hypothyroidism, hyperlipidemia, migraine headaches, morbid obesity, depression, history of polycystic ovary syndrome.  History of vitamin D deficiency.  Review of Systems urinating about every hour. No dysuria. No nausea, no vomiting     Objective:   Physical Exam   No CVA tenderness on exam.  T 98.3 pulse 83 pulse ox 98% Dipstick UA is completely normal.  Culture was sent.     Assessment & Plan:   Urinary frequency-etiology unclear.  Dipstick UA shows no evidence of UTI or hematuria.?  Urethritis.  No glucosuria.  Plan: Treat today with Cipro 500 mg twice daily for 7 days.  Await culture results.  Follow-up with her Monday at time of CPE.  Take Diflucan if develop Candida vaginitis while on antibiotic therapy.  Flu vaccine given.

## 2021-06-10 NOTE — Patient Instructions (Addendum)
Flu vaccine given. Cipro 500 mg twice daily x 7 days. Culture urine. Diflucan if needed. See Monday for CPE. Labs drawn and are pending.

## 2021-06-11 LAB — URINE CULTURE
MICRO NUMBER:: 12595674
SPECIMEN QUALITY:: ADEQUATE

## 2021-06-11 LAB — CBC WITH DIFFERENTIAL/PLATELET
Absolute Monocytes: 474 cells/uL (ref 200–950)
Basophils Absolute: 37 cells/uL (ref 0–200)
Basophils Relative: 0.5 %
Eosinophils Absolute: 59 cells/uL (ref 15–500)
Eosinophils Relative: 0.8 %
HCT: 38.5 % (ref 35.0–45.0)
Hemoglobin: 12.8 g/dL (ref 11.7–15.5)
Lymphs Abs: 2168 cells/uL (ref 850–3900)
MCH: 30.6 pg (ref 27.0–33.0)
MCHC: 33.2 g/dL (ref 32.0–36.0)
MCV: 92.1 fL (ref 80.0–100.0)
MPV: 11.3 fL (ref 7.5–12.5)
Monocytes Relative: 6.4 %
Neutro Abs: 4662 cells/uL (ref 1500–7800)
Neutrophils Relative %: 63 %
Platelets: 315 10*3/uL (ref 140–400)
RBC: 4.18 10*6/uL (ref 3.80–5.10)
RDW: 13.1 % (ref 11.0–15.0)
Total Lymphocyte: 29.3 %
WBC: 7.4 10*3/uL (ref 3.8–10.8)

## 2021-06-11 LAB — COMPLETE METABOLIC PANEL WITH GFR
AG Ratio: 1.8 (calc) (ref 1.0–2.5)
ALT: 14 U/L (ref 6–29)
AST: 15 U/L (ref 10–30)
Albumin: 4 g/dL (ref 3.6–5.1)
Alkaline phosphatase (APISO): 46 U/L (ref 31–125)
BUN: 8 mg/dL (ref 7–25)
CO2: 30 mmol/L (ref 20–32)
Calcium: 9 mg/dL (ref 8.6–10.2)
Chloride: 105 mmol/L (ref 98–110)
Creat: 0.71 mg/dL (ref 0.50–0.97)
Globulin: 2.2 g/dL (calc) (ref 1.9–3.7)
Glucose, Bld: 77 mg/dL (ref 65–99)
Potassium: 4.5 mmol/L (ref 3.5–5.3)
Sodium: 140 mmol/L (ref 135–146)
Total Bilirubin: 0.6 mg/dL (ref 0.2–1.2)
Total Protein: 6.2 g/dL (ref 6.1–8.1)
eGFR: 114 mL/min/{1.73_m2} (ref >=60)

## 2021-06-11 LAB — LIPID PANEL
Cholesterol: 142 mg/dL (ref <200)
HDL: 36 mg/dL — ABNORMAL LOW (ref >=50)
LDL Cholesterol (Calc): 77 mg/dL (calc)
Non-HDL Cholesterol (Calc): 106 mg/dL (calc) (ref <130)
Total CHOL/HDL Ratio: 3.9 (calc) (ref <5.0)
Triglycerides: 191 mg/dL — ABNORMAL HIGH (ref <150)

## 2021-06-11 LAB — TSH: TSH: 1.18 mIU/L

## 2021-06-13 ENCOUNTER — Other Ambulatory Visit: Payer: Self-pay

## 2021-06-13 ENCOUNTER — Ambulatory Visit (INDEPENDENT_AMBULATORY_CARE_PROVIDER_SITE_OTHER): Payer: BLUE CROSS/BLUE SHIELD | Admitting: Internal Medicine

## 2021-06-13 ENCOUNTER — Encounter: Payer: Self-pay | Admitting: Internal Medicine

## 2021-06-13 ENCOUNTER — Other Ambulatory Visit: Payer: Self-pay | Admitting: Internal Medicine

## 2021-06-13 VITALS — BP 118/84 | HR 93 | Temp 97.6°F | Ht 68.0 in | Wt 280.0 lb

## 2021-06-13 DIAGNOSIS — E039 Hypothyroidism, unspecified: Secondary | ICD-10-CM | POA: Diagnosis not present

## 2021-06-13 DIAGNOSIS — R350 Frequency of micturition: Secondary | ICD-10-CM | POA: Diagnosis not present

## 2021-06-13 DIAGNOSIS — Z6841 Body Mass Index (BMI) 40.0 and over, adult: Secondary | ICD-10-CM

## 2021-06-13 DIAGNOSIS — F32A Depression, unspecified: Secondary | ICD-10-CM

## 2021-06-13 DIAGNOSIS — E782 Mixed hyperlipidemia: Secondary | ICD-10-CM

## 2021-06-13 DIAGNOSIS — R7302 Impaired glucose tolerance (oral): Secondary | ICD-10-CM | POA: Diagnosis not present

## 2021-06-13 DIAGNOSIS — F419 Anxiety disorder, unspecified: Secondary | ICD-10-CM

## 2021-06-13 LAB — POCT URINALYSIS DIPSTICK
Bilirubin, UA: NEGATIVE
Blood, UA: NEGATIVE
Glucose, UA: NEGATIVE
Ketones, UA: NEGATIVE
Leukocytes, UA: NEGATIVE
Nitrite, UA: NEGATIVE
Protein, UA: NEGATIVE
Spec Grav, UA: 1.01 (ref 1.010–1.025)
Urobilinogen, UA: 0.2 E.U./dL
pH, UA: 7.5 (ref 5.0–8.0)

## 2021-06-13 NOTE — Patient Instructions (Addendum)
It was a pleasure seeing you today.  Continue to work on diet ,exercise, and weight loss.  Triglycerides were elevated with this visit.  Continue current dose of statin medication.  Glad to hear urine symptoms have improved.Likely had urethral irritation.  Have Covid vaccine booster after November 15. Flu vaccine up to date.

## 2021-06-13 NOTE — Progress Notes (Signed)
   Subjective:    Patient ID: Dawn Navarro, female    DOB: 1985-11-10, 35 y.o.   MRN: [id number]  HPI 35 year old Female for 6 month follow up.Triglycerides are elevated.  In May when she was getting up hay, her lipids were WNL.  TSH is normal at 1.18. Hx of hypothyroidism treated with thyroid replacement medication.   Patient advised to watch butter consumption and increase exercise.She does not want to increase statin at this time. Re-evaluate in 6 months.  Had flu vaccine last week.   Children have had RSV last week. Patient did not get it. Patient had Covid in August. Needs to get  Covid booster after November 15.  Discussed gastric bypass. Patient not really interested.  Was seen November 4th with UTI symptoms. Was treated with Cipro 500 for 7 days.  Culture grew mixed flora.  She is feeling better and has no symptoms today.  Urine was not repeated today.     Review of Systems no new complaints.  Discussed diet ,exercise, and weight loss.  Has IUD for birth control     Objective:   Physical Exam Blood pressure 118/84 pulse 93 regular temperature 97.6 pulse oximetry 94% weight 280 pounds height 5 feet 8 inches BMI 42.56  Skin: Warm and dry.  No thyromegaly.  No carotid bruits.  No cervical adenopathy.  Chest is clear to auscultation.  Cardiac exam: Regular rate and rhythm .No lower extremity pitting edema.  Affect, thought ,and judgment are normal.     Assessment & Plan:  Elevated triglycerides: We discussed increasing dose of statin (Lipitor 40 mg daily) medication.  She prefers not to do that.  She will try to increase exercise.  We will follow-up in 6 months at time of health maintenance exam.   BMI 42.57 (Weight 280 pounds) Bariatric surgery discussed.  She is not interested at this time.  Hypothyroidism-TSH is normal on current dose of thyroid replacement medication.  Recent urinary frequency:  Urine culture revealed mixed flora.  She was treated with Cipro and is  feeling better. Likely had urethritis.  Morbid obesity-not interested in bariatric surgery.  Continue to work on diet and exercise  Anxiety depression treated with Zoloft 50 mg daily and stable.  Impaired glucose tolerance treated with metformin.  Hemoglobin A1c in April 2021 was 5.7%.  Generally this is under good control.  Health maintenance received flu vaccine November 4th.  Is to have COVID-vaccine in the near future after COVID November.  Tetanus immunization is up-to-date.

## 2021-06-27 ENCOUNTER — Other Ambulatory Visit: Payer: Self-pay | Admitting: Internal Medicine

## 2021-07-04 ENCOUNTER — Other Ambulatory Visit: Payer: Self-pay | Admitting: Internal Medicine

## 2021-07-05 ENCOUNTER — Telehealth: Payer: Self-pay | Admitting: Internal Medicine

## 2021-07-05 DIAGNOSIS — Z0289 Encounter for other administrative examinations: Secondary | ICD-10-CM

## 2021-07-05 NOTE — Telephone Encounter (Signed)
Completed and Mailed 2022 Wellness Program Physical and Flu Shot Verification Form  Fax [phone]  Mailed Original  [facility] [address] [place], [place] [address]

## 2021-08-13 ENCOUNTER — Other Ambulatory Visit: Payer: Self-pay | Admitting: Internal Medicine

## 2021-09-28 ENCOUNTER — Other Ambulatory Visit: Payer: Self-pay | Admitting: Internal Medicine

## 2021-12-08 ENCOUNTER — Other Ambulatory Visit: Payer: Self-pay

## 2021-12-12 ENCOUNTER — Other Ambulatory Visit: Payer: BC Managed Care – PPO

## 2021-12-12 DIAGNOSIS — R7302 Impaired glucose tolerance (oral): Secondary | ICD-10-CM

## 2021-12-12 DIAGNOSIS — E782 Mixed hyperlipidemia: Secondary | ICD-10-CM

## 2021-12-12 DIAGNOSIS — F32A Anxiety disorder, unspecified: Secondary | ICD-10-CM

## 2021-12-12 DIAGNOSIS — E039 Hypothyroidism, unspecified: Secondary | ICD-10-CM

## 2021-12-12 DIAGNOSIS — F419 Anxiety disorder, unspecified: Secondary | ICD-10-CM | POA: Diagnosis not present

## 2021-12-13 LAB — COMPLETE METABOLIC PANEL WITH GFR
AG Ratio: 1.5 (calc) (ref 1.0–2.5)
ALT: 16 U/L (ref 6–29)
AST: 15 U/L (ref 10–30)
Albumin: 4.1 g/dL (ref 3.6–5.1)
Alkaline phosphatase (APISO): 72 U/L (ref 31–125)
BUN: 8 mg/dL (ref 7–25)
CO2: 28 mmol/L (ref 20–32)
Calcium: 9.3 mg/dL (ref 8.6–10.2)
Chloride: 105 mmol/L (ref 98–110)
Creat: 0.71 mg/dL (ref 0.50–0.97)
Globulin: 2.7 g/dL (calc) (ref 1.9–3.7)
Glucose, Bld: 93 mg/dL (ref 65–99)
Potassium: 4.3 mmol/L (ref 3.5–5.3)
Sodium: 141 mmol/L (ref 135–146)
Total Bilirubin: 0.5 mg/dL (ref 0.2–1.2)
Total Protein: 6.8 g/dL (ref 6.1–8.1)
eGFR: 113 mL/min/{1.73_m2} (ref >=60)

## 2021-12-13 LAB — CBC WITH DIFFERENTIAL/PLATELET
Absolute Monocytes: 435 cells/uL (ref 200–950)
Basophils Absolute: 32 cells/uL (ref 0–200)
Basophils Relative: 0.5 %
Eosinophils Absolute: 83 cells/uL (ref 15–500)
Eosinophils Relative: 1.3 %
HCT: 39.5 % (ref 35.0–45.0)
Hemoglobin: 13.3 g/dL (ref 11.7–15.5)
Lymphs Abs: 1894 cells/uL (ref 850–3900)
MCH: 31.4 pg (ref 27.0–33.0)
MCHC: 33.7 g/dL (ref 32.0–36.0)
MCV: 93.2 fL (ref 80.0–100.0)
MPV: 11.8 fL (ref 7.5–12.5)
Monocytes Relative: 6.8 %
Neutro Abs: 3955 cells/uL (ref 1500–7800)
Neutrophils Relative %: 61.8 %
Platelets: 305 10*3/uL (ref 140–400)
RBC: 4.24 10*6/uL (ref 3.80–5.10)
RDW: 13.4 % (ref 11.0–15.0)
Total Lymphocyte: 29.6 %
WBC: 6.4 10*3/uL (ref 3.8–10.8)

## 2021-12-13 LAB — LIPID PANEL
Cholesterol: 152 mg/dL (ref <200)
HDL: 36 mg/dL — ABNORMAL LOW (ref >=50)
LDL Cholesterol (Calc): 86 mg/dL (calc)
Non-HDL Cholesterol (Calc): 116 mg/dL (calc) (ref <130)
Total CHOL/HDL Ratio: 4.2 (calc) (ref <5.0)
Triglycerides: 210 mg/dL — ABNORMAL HIGH (ref <150)

## 2021-12-13 LAB — TSH: TSH: 0.61 mIU/L

## 2021-12-13 LAB — HEMOGLOBIN A1C
Hgb A1c MFr Bld: 5.9 % of total Hgb — ABNORMAL HIGH (ref <5.7)
Mean Plasma Glucose: 123 mg/dL
eAG (mmol/L): 6.8 mmol/L

## 2021-12-14 DIAGNOSIS — Z0289 Encounter for other administrative examinations: Secondary | ICD-10-CM

## 2021-12-15 ENCOUNTER — Ambulatory Visit (INDEPENDENT_AMBULATORY_CARE_PROVIDER_SITE_OTHER): Payer: BC Managed Care – PPO | Admitting: Internal Medicine

## 2021-12-15 ENCOUNTER — Encounter: Payer: Self-pay | Admitting: Internal Medicine

## 2021-12-15 VITALS — BP 110/86 | HR 104 | Temp 97.8°F | Ht 68.0 in | Wt 309.5 lb

## 2021-12-15 DIAGNOSIS — R7302 Impaired glucose tolerance (oral): Secondary | ICD-10-CM | POA: Diagnosis not present

## 2021-12-15 DIAGNOSIS — Z0000 Encounter for general adult medical examination without abnormal findings: Secondary | ICD-10-CM

## 2021-12-15 DIAGNOSIS — F419 Anxiety disorder, unspecified: Secondary | ICD-10-CM

## 2021-12-15 DIAGNOSIS — F32A Depression, unspecified: Secondary | ICD-10-CM

## 2021-12-15 DIAGNOSIS — E782 Mixed hyperlipidemia: Secondary | ICD-10-CM | POA: Diagnosis not present

## 2021-12-15 DIAGNOSIS — Z6841 Body Mass Index (BMI) 40.0 and over, adult: Secondary | ICD-10-CM | POA: Diagnosis not present

## 2021-12-15 DIAGNOSIS — Z8669 Personal history of other diseases of the nervous system and sense organs: Secondary | ICD-10-CM

## 2021-12-15 DIAGNOSIS — J22 Unspecified acute lower respiratory infection: Secondary | ICD-10-CM

## 2021-12-15 DIAGNOSIS — E039 Hypothyroidism, unspecified: Secondary | ICD-10-CM

## 2021-12-15 LAB — POCT URINALYSIS DIPSTICK
Bilirubin, UA: NEGATIVE
Blood, UA: NEGATIVE
Glucose, UA: NEGATIVE
Ketones, UA: NEGATIVE
Leukocytes, UA: NEGATIVE
Nitrite, UA: NEGATIVE
Protein, UA: NEGATIVE
Spec Grav, UA: <=1.005 — AB (ref 1.010–1.025)
Urobilinogen, UA: 0.2 E.U./dL
pH, UA: 8 (ref 5.0–8.0)

## 2021-12-15 MED ORDER — PREDNISONE 10 MG PO TABS
ORAL_TABLET | ORAL | 0 refills | Status: DC
Start: 2021-12-15 — End: 2022-01-20

## 2021-12-15 MED ORDER — ALBUTEROL SULFATE HFA 108 (90 BASE) MCG/ACT IN AERS: 2.0000 | INHALATION_SPRAY | Freq: Four times a day (QID) | RESPIRATORY_TRACT | 99 refills | Status: DC | PRN

## 2021-12-15 MED ORDER — ALBUTEROL SULFATE HFA 108 (90 BASE) MCG/ACT IN AERS: 2.0000 | INHALATION_SPRAY | Freq: Four times a day (QID) | RESPIRATORY_TRACT | 2 refills | Status: DC | PRN

## 2021-12-15 MED ORDER — AZITHROMYCIN 250 MG PO TABS: ORAL_TABLET | ORAL | 0 refills | Status: AC

## 2021-12-15 MED ORDER — METFORMIN HCL 500 MG PO TABS: 500.0000 mg | ORAL_TABLET | Freq: Two times a day (BID) | ORAL | 3 refills | Status: DC

## 2021-12-15 MED ORDER — ATORVASTATIN CALCIUM 80 MG PO TABS: 80.0000 mg | ORAL_TABLET | Freq: Every day | ORAL | 3 refills | Status: DC

## 2021-12-15 MED ORDER — PREDNISONE 10 MG PO TABS: ORAL_TABLET | ORAL | 0 refills | Status: DC

## 2021-12-15 MED ORDER — HYDROCODONE BIT-HOMATROP MBR 5-1.5 MG/5ML PO SOLN: 5.0000 mL | Freq: Three times a day (TID) | ORAL | 0 refills | Status: DC | PRN

## 2021-12-15 MED ORDER — LEVOCETIRIZINE DIHYDROCHLORIDE 5 MG PO TABS: 5.0000 mg | ORAL_TABLET | Freq: Every evening | ORAL | 5 refills | Status: AC

## 2021-12-15 NOTE — Patient Instructions (Addendum)
Patient will take prednisone in tapering course as directed for allergic rhinitis.  Allergy referral will be made.  She appears to be allergic to hay and may be other agents as well.  This seems to happen every year when she is working in [employer].  Hycodan has been prescribed for cough.    Weight loss discussed.  Needs to find diet program close to her home.  If unsuccessful with finding a program,  she needs to consider weight loss surgery.  She is [place] active [place] her [place].   Increase Lipitor to 80 mg daily. Follow up in September with lipid panel liver functions and office visit.

## 2021-12-25 NOTE — Progress Notes (Signed)
Subjective:    Patient ID: Dawn Navarro, female    DOB: 1985-12-03, 36 y.o.   MRN: [id number]  HPI 36 year old Female seen for health maintenance exam and evaluation of medical issues.  Currently, she is having a flareup of allergic rhinitis.  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  Allergy referral will be made.  She has never been allergy tested.  She will be treated with short course of steroids and Hycodan cough syrup.  She continues to struggle with morbid obesity.  Weight is 309 pounds 8 ounces.  BMI is 47.06.  She resides in the [place] area.  It is not easy for her to get to [facility] to see dietitian or participate in diet program.  Have suggested that she call [facility] health and see what options are available.  If she cannot follow a diet and exercise program, she would be a candidate for weight loss surgery.  Weight last year was 286 pounds so she has gained 23 pounds within the past year.  She has a history of hypothyroidism, hyperlipidemia, migraine headaches, depression, morbid obesity, polycystic ovary syndrome.  History of pleva treated by Dr. Charlton Haws, Dermatologist but this has improved.  History of allergic rhinitis and recurrent ear infections.  Has been on thyroid replacement medication since 2010.  [facility] care.  She has a Mirena device.  In 2012 she had a Pap smear showing ASCUS.  She took Clomid to get pregnant for the first time.  First child was delivered February 2012 by C-section for failure to progress.  History of vitamin D deficiency.  Social history: She is married.  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  She has 2 children.  She lives in [place].  Does not smoke or consume alcohol.  Family history: Mother with history of hypothyroidism.  Father with history of non-Hodgkin's lymphoma and remote history of  prostate cancer.  2 brothers in good health.  Parents both have hyperlipidemia.  Mother has been diagnosed with atrial fibrillation.    Review of Systems-currently has respiratory congestion     Objective:   Physical Exam Blood pressure 110/86 pulse 104 regular pulse oximetry 98% temperature 97.8 degrees weight 309 pounds 8 ounces BMI 47.06 Skin: Warm and dry.  No cervical adenopathy.  TMs are not red.  Pharynx very slightly injected.  Neck supple.  No thyromegaly.  No carotid bruits.  Chest is clear to auscultation.  Breast are pendulous without masses.  Cardiac exam: Regular rate and rhythm without ectopy or murmurs.  Abdomen obese soft nondistended without hepatosplenomegaly masses or tenderness.  GYN exam deferred to GYN physician.  No pitting edema of the lower extremities.  No gross focal deficits on brief neurological exam.      Assessment & Plan:  Morbid obesity-she lives in [place].  It is not convenient to come to [facility] for a diet program.  She may need to find a diet program nearby.  If unsuccessful with this, she should consider bariatric surgery.  Lipitor is being increased to 80 mg daily with follow-up in September.  Triglycerides are elevated at 210-total cholesterol and LDL cholesterol are normal.  Follow-up on lipid management in 4 months.  2 years ago in 2021 total cholesterol was 246, HDL 40, triglycerides 214 and LDL cholesterol was 168.  She has mixed hyperlipidemia.  Low HDL  cholesterol of 36  For allergic rhinitis symptoms, she will take prednisone in tapering course as directed and Allergy referral will be made.  She thinks she may be allergic to hay and other agents as well.  This seems to happen every year when she is working in the [employer].  She will take Hycodan sparingly for cough.  Continue albuterol inhaler and Xyzal once prednisone taper has been completed.  Acute lower respiratory infection-will be treated with Zithromax Z-PAK  Mild depression treated  with Zoloft 50 mg daily  Hypothyroidism-continue current dose of thyroid replacement medication  Impaired glucose tolerance-hemoglobin A1c 5.9% and treated with metformin

## 2021-12-29 ENCOUNTER — Other Ambulatory Visit: Payer: Self-pay | Admitting: Internal Medicine

## 2021-12-29 MED ORDER — HYDROCODONE BIT-HOMATROP MBR 5-1.5 MG/5ML PO SOLN
5.0000 mL | Freq: Three times a day (TID) | ORAL | 0 refills | Status: DC | PRN
Start: 2021-12-29 — End: 2022-01-20

## 2021-12-29 NOTE — Telephone Encounter (Signed)
Pt notified that refill has been sent 

## 2022-01-20 ENCOUNTER — Telehealth: Payer: BC Managed Care – PPO | Admitting: Physician Assistant

## 2022-01-20 ENCOUNTER — Telehealth: Payer: Self-pay | Admitting: Internal Medicine

## 2022-01-20 DIAGNOSIS — J22 Unspecified acute lower respiratory infection: Secondary | ICD-10-CM

## 2022-01-20 MED ORDER — ALBUTEROL SULFATE HFA 108 (90 BASE) MCG/ACT IN AERS: 2.0000 | INHALATION_SPRAY | Freq: Four times a day (QID) | RESPIRATORY_TRACT | 99 refills | Status: DC | PRN

## 2022-01-20 MED ORDER — PSEUDOEPH-BROMPHEN-DM 30-2-10 MG/5ML PO SYRP: 5.0000 mL | ORAL_SOLUTION | Freq: Four times a day (QID) | ORAL | 0 refills | Status: DC | PRN

## 2022-01-20 MED ORDER — BENZONATATE 100 MG PO CAPS: 100.0000 mg | ORAL_CAPSULE | Freq: Three times a day (TID) | ORAL | 0 refills | Status: DC | PRN

## 2022-01-20 MED ORDER — AZITHROMYCIN 250 MG PO TABS: ORAL_TABLET | ORAL | 0 refills | Status: AC

## 2022-01-20 MED ORDER — PREDNISONE 10 MG (21) PO TBPK: ORAL_TABLET | ORAL | 0 refills | Status: DC

## 2022-01-20 NOTE — Telephone Encounter (Signed)
Dawn Navarro [phone]  Carle called to say she was wanting to have a video visit she is still sick, not getting much better. I let her know you were out of office and the best thing for her to do was a video visit thru Mychart.

## 2022-01-20 NOTE — Progress Notes (Signed)
Virtual Visit Consent   Dawn Navarro, you are scheduled for a virtual visit with a [facility] provider today. Just as with appointments in the office, your consent must be obtained to participate. Your consent will be active for this visit and any virtual visit you may have with one of our providers in the next 365 days. If you have a MyChart account, a copy of this consent can be sent to you electronically.  As this is a virtual visit, video technology does not allow for your provider to perform a traditional examination. This may limit your provider's ability to fully assess your condition. If your provider identifies any concerns that need to be evaluated in person or the need to arrange testing (such as labs, EKG, etc.), we will make arrangements to do so. Although advances in technology are sophisticated, we cannot ensure that it will always work on either your end or our end. If the connection with a video visit is poor, the visit may have to be switched to a telephone visit. With either a video or telephone visit, we are not always able to ensure that we have a secure connection.  By engaging in this virtual visit, you consent to the provision of healthcare and authorize for your insurance to be billed (if applicable) for the services provided during this visit. Depending on your insurance coverage, you may receive a charge related to this service.  I need to obtain your verbal consent now. Are you willing to proceed with your visit today? Dawn Navarro has provided verbal consent on 01/20/2022 for a virtual visit (video or telephone). Margaretann Loveless, PA-C  Date: 01/20/2022 9:35 AM  Virtual Visit via Video Note   I, Margaretann Loveless, connected with  Dawn Navarro  ([id number], Feb 13, 1986) on 01/20/22 at  9:30 AM EDT by a video-enabled telemedicine application and verified that I am speaking with the correct person using two identifiers.  Location: Patient: Virtual Visit Location  Patient: Home Provider: Virtual Visit Location Provider: Home Office   I discussed the limitations of evaluation and management by telemedicine and the availability of in person appointments. The patient expressed understanding and agreed to proceed.    History of Present Illness: Dawn Navarro is a 36 y.o. who identifies as a female who was assigned female at birth, and is being seen today for recurring cough.  HPI: Cough This is a recurrent problem. The current episode started 1 to 4 weeks ago. The problem has been gradually worsening. The problem occurs every few minutes. The cough is Non-productive. Associated symptoms include chest pain, headaches, nasal congestion, postnasal drip and rhinorrhea. Pertinent negatives include no ear congestion, ear pain, fever, shortness of breath or wheezing. The symptoms are aggravated by other (smoke from [place] wildfires). Treatments tried: nyquil, dayquil. The treatment provided no relief. Her past medical history is significant for bronchitis and environmental allergies.  Seen by PCP on 12/15/21 and diagnosed with lower resp infection. Treated with Zpack, Hycodan cough syrup, and prednisone. Symptoms resolved. Seeing allergist on Aug 1.   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   Depression 10/30/2015   Recurrent otitis media 09/28/2014   Hyperlipidemia 02/08/2013   Hx of migraines 02/27/2012   Polycystic ovary syndrome 06/04/2011   Hypothyroidism 06/04/2011   Obesity 06/04/2011    Allergies:  Allergies  Allergen Reactions   Latex Rash    Skin bubbles  up and sluffs off   Medications:  Current Outpatient Medications:    azithromycin (ZITHROMAX) 250 MG tablet, Take 2 tablets on day 1, then 1 tablet daily on days 2 through 5, Disp: 6 tablet, Rfl: 0   benzonatate (TESSALON) 100 MG capsule, Take 1 capsule (100 mg total) by mouth 3 (three) times daily as  needed., Disp: 30 capsule, Rfl: 0   brompheniramine-pseudoephedrine-DM 30-2-10 MG/5ML syrup, Take 5 mLs by mouth 4 (four) times daily as needed., Disp: 120 mL, Rfl: 0   predniSONE (STERAPRED UNI-PAK 21 TAB) 10 MG (21) TBPK tablet, 6 day taper; take as directed on package instructions, Disp: 21 tablet, Rfl: 0   albuterol (VENTOLIN HFA) 108 (90 Base) MCG/ACT inhaler, Inhale 2 puffs into the lungs every 6 (six) hours as needed for wheezing or shortness of breath., Disp: 8 g, Rfl: PRN   atorvastatin (LIPITOR) 80 MG tablet, Take 1 tablet (80 mg total) by mouth daily., Disp: 90 tablet, Rfl: 3   Calcium Carbonate-Vit D-Min (CALCIUM 1200 PO), Take by mouth., Disp: , Rfl:    cholecalciferol (VITAMIN D3) 25 MCG (1000 UNIT) tablet, Take 1,000 Units by mouth daily., Disp: , Rfl:    levocetirizine (XYZAL ALLERGY 24HR) 5 MG tablet, Take 1 tablet (5 mg total) by mouth every evening., Disp: 30 tablet, Rfl: 5   levonorgestrel (MIRENA) 20 MCG/24HR IUD, 1 each by Intrauterine route once. Inserted 04/2021, Disp: , Rfl:    levothyroxine (SYNTHROID) 100 MCG tablet, TAKE 1 TABLET BY MOUTH EVERY DAY, Disp: 90 tablet, Rfl: 1   metFORMIN (GLUCOPHAGE) 500 MG tablet, Take 1 tablet (500 mg total) by mouth 2 (two) times daily with a meal., Disp: 180 tablet, Rfl: 3   Multiple Vitamin (MULTIVITAMIN) tablet, Take 1 tablet by mouth daily., Disp: , Rfl:    sertraline (ZOLOFT) 50 MG tablet, TAKE 1 TABLET BY MOUTH EVERY DAY, Disp: 90 tablet, Rfl: 2   vitamin B-12 (CYANOCOBALAMIN) 500 MCG tablet, Take 500 mcg by mouth daily., Disp: , Rfl:   Observations/Objective: Patient is well-developed, well-nourished in no acute distress.  Resting comfortably at home.  Head is normocephalic, atraumatic.  No labored breathing.  Speech is clear and coherent with logical content.  Patient is alert and oriented at baseline.  Dry, hacking cough heard frequently, but not affecting speech  Assessment and Plan: 1. Lower resp. tract infection -  azithromycin (ZITHROMAX) 250 MG tablet; Take 2 tablets on day 1, then 1 tablet daily on days 2 through 5  Dispense: 6 tablet; Refill: 0 - predniSONE (STERAPRED UNI-PAK 21 TAB) 10 MG (21) TBPK tablet; 6 day taper; take as directed on package instructions  Dispense: 21 tablet; Refill: 0 - brompheniramine-pseudoephedrine-DM 30-2-10 MG/5ML syrup; Take 5 mLs by mouth 4 (four) times daily as needed.  Dispense: 120 mL; Refill: 0 - benzonatate (TESSALON) 100 MG capsule; Take 1 capsule (100 mg total) by mouth 3 (three) times daily as needed.  Dispense: 30 capsule; Refill: 0 - albuterol (VENTOLIN HFA) 108 (90 Base) MCG/ACT inhaler; Inhale 2 puffs into the lungs every 6 (six) hours as needed for wheezing or shortness of breath.  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  - Rest.  - Steam and humidifier can help - Seek in person evaluation if worsening or symptoms  fail to improve    Follow Up Instructions: I discussed the assessment and treatment plan with the patient. The patient was provided an opportunity to ask questions and all were answered. The patient agreed with the plan and demonstrated an understanding of the instructions.  A copy of instructions were sent to the patient via MyChart unless otherwise noted below.    The patient was advised to call back or seek an in-person evaluation if the symptoms worsen or if the condition fails to improve as anticipated.  Time:  I spent 12 minutes with the patient via telehealth technology discussing the above problems/concerns.    Margaretann Loveless, PA-C

## 2022-01-20 NOTE — Patient Instructions (Signed)
Dawn Navarro, thank you for joining Mar Daring, PA-C for today's virtual visit.  While this provider is not your primary care provider (PCP), if your PCP is located in our provider database this encounter information will be shared with them immediately following your visit.  Consent: (Patient) Dawn Navarro provided verbal consent for this virtual visit at the beginning of the encounter.  Current Medications:  Current Outpatient Medications:    azithromycin (ZITHROMAX) 250 MG tablet, Take 2 tablets on day 1, then 1 tablet daily on days 2 through 5, Disp: 6 tablet, Rfl: 0   benzonatate (TESSALON) 100 MG capsule, Take 1 capsule (100 mg total) by mouth 3 (three) times daily as needed., Disp: 30 capsule, Rfl: 0   brompheniramine-pseudoephedrine-DM 30-2-10 MG/5ML syrup, Take 5 mLs by mouth 4 (four) times daily as needed., Disp: 120 mL, Rfl: 0   predniSONE (STERAPRED UNI-PAK 21 TAB) 10 MG (21) TBPK tablet, 6 day taper; take as directed on package instructions, Disp: 21 tablet, Rfl: 0   albuterol (VENTOLIN HFA) 108 (90 Base) MCG/ACT inhaler, Inhale 2 puffs into the lungs every 6 (six) hours as needed for wheezing or shortness of breath., Disp: 8 g, Rfl: PRN   atorvastatin (LIPITOR) 80 MG tablet, Take 1 tablet (80 mg total) by mouth daily., Disp: 90 tablet, Rfl: 3   Calcium Carbonate-Vit D-Min (CALCIUM 1200 PO), Take by mouth., Disp: , Rfl:    cholecalciferol (VITAMIN D3) 25 MCG (1000 UNIT) tablet, Take 1,000 Units by mouth daily., Disp: , Rfl:    levocetirizine (XYZAL ALLERGY 24HR) 5 MG tablet, Take 1 tablet (5 mg total) by mouth every evening., Disp: 30 tablet, Rfl: 5   levonorgestrel (MIRENA) 20 MCG/24HR IUD, 1 each by Intrauterine route once. Inserted 04/2021, Disp: , Rfl:    levothyroxine (SYNTHROID) 100 MCG tablet, TAKE 1 TABLET BY MOUTH EVERY DAY, Disp: 90 tablet, Rfl: 1   metFORMIN (GLUCOPHAGE) 500 MG tablet, Take 1 tablet (500 mg total) by mouth 2 (two) times daily with a meal.,  Disp: 180 tablet, Rfl: 3   Multiple Vitamin (MULTIVITAMIN) tablet, Take 1 tablet by mouth daily., Disp: , Rfl:    sertraline (ZOLOFT) 50 MG tablet, TAKE 1 TABLET BY MOUTH EVERY DAY, Disp: 90 tablet, Rfl: 2   vitamin B-12 (CYANOCOBALAMIN) 500 MCG tablet, Take 500 mcg by mouth daily., Disp: , Rfl:    Medications ordered in this encounter:  Meds ordered this encounter  Medications   azithromycin (ZITHROMAX) 250 MG tablet    Sig: Take 2 tablets on day 1, then 1 tablet daily on days 2 through 5    Dispense:  6 tablet    Refill:  0    Order Specific Question:   Supervising Provider    Answer:   Sabra Heck, BRIAN [[id number]]   predniSONE (STERAPRED UNI-PAK 21 TAB) 10 MG (21) TBPK tablet    Sig: 6 day taper; take as directed on package instructions    Dispense:  21 tablet    Refill:  0    Order Specific Question:   Supervising Provider    Answer:   Sabra Heck, BRIAN [id number]   brompheniramine-pseudoephedrine-DM 30-2-10 MG/5ML syrup    Sig: Take 5 mLs by mouth 4 (four) times daily as needed.    Dispense:  120 mL    Refill:  0    Order Specific Question:   Supervising Provider    Answer:   MILLER, BRIAN [[id number]]   benzonatate (TESSALON) 100 MG capsule    Sig:  Take 1 capsule (100 mg total) by mouth 3 (three) times daily as needed.    Dispense:  30 capsule    Refill:  0    Order Specific Question:   Supervising Provider    Answer:   MILLER, BRIAN [[id number]]   albuterol (VENTOLIN HFA) 108 (90 Base) MCG/ACT inhaler    Sig: Inhale 2 puffs into the lungs every 6 (six) hours as needed for wheezing or shortness of breath.    Dispense:  8 g    Refill:  PRN    Order Specific Question:   Supervising Provider    Answer:   Eber Hong [[id number]]     *If you need refills on other medications prior to your next appointment, please contact your pharmacy*  Follow-Up: Call back or seek an in-person evaluation if the symptoms worsen or if the condition fails to improve as anticipated.  Other Instructions Asthma,  Adult  Asthma is a condition that causes swelling and narrowing of the airways. These are the passages that lead from the nose and mouth down into the lungs. When asthma symptoms get worse it is called an asthma attack or flare. This can make it hard to breathe. Asthma flares can range from minor to life-threatening. There is no cure for asthma, but medicines and lifestyle changes can help to control it. What are the causes? It is not known exactly what causes asthma, but certain things can cause asthma symptoms to get worse (triggers). What can trigger an asthma attack? Cigarette smoke. Mold. Dust. Your pet's skin flakes (dander). Cockroaches. Pollen. Air pollution (like household cleaners, wood smoke, smog, or Therapist, occupational). What are the signs or symptoms? Trouble breathing (shortness of breath). Coughing. Making high-pitched whistling sounds when you breathe, most often when you breathe out (wheezing). Chest tightness. Tiredness with little activity. Poor exercise tolerance. How is this treated? Controller medicines that help prevent asthma symptoms. Fast-acting reliever or rescue medicines. These give short-term relief of asthma symptoms. Allergy medicines if your attacks are brought on by allergens. Medicines to help control the body's defense (immune) system. Staying away from the things that cause asthma attacks. Follow these instructions at home: Avoiding triggers in your home Do not allow anyone to smoke in your home. Limit use of fireplaces and wood stoves. Get rid of pests (such as roaches and mice) and their droppings. Keep your home clean. Clean your floors. Dust regularly. Use cleaning products that do not smell. Wash bed sheets and blankets every week in hot water. Dry them in a dryer. Have someone vacuum when you are not home. Change your heating and air conditioning filters often. Use blankets that are made of polyester or cotton. General instructions Take  over-the-counter and prescription medicines only as told by your doctor. Do not smoke or use any products that contain nicotine or tobacco. If you need help quitting, ask your doctor. Stay away from secondhand smoke. Avoid doing things outdoors when allergen counts are high and when air quality is low. Warm up before you exercise. Take time to cool down after exercise. Use a peak flow meter as told by your doctor. A peak flow meter is a tool that measures how well your lungs are working. Keep track of the peak flow meter's readings. Write them down. Follow your asthma action plan. This is a written plan for taking care of your asthma and treating your attacks. Make sure you get all the shots (vaccines) that your doctor recommends. Ask your doctor about a  flu shot and a pneumonia shot. Keep all follow-up visits. Contact a doctor if: You have wheezing, shortness of breath, or a cough even while taking medicine to prevent attacks. The mucus you cough up (sputum) is thicker than usual. The mucus you cough up changes from clear or white to yellow, green, gray, or is bloody. You have problems from the medicine you are taking, such as: A rash. Itching. Swelling. Trouble breathing. You need reliever medicines more than 2-3 times a week. Your peak flow reading is still at 50-79% of your personal best after following the action plan for 1 hour. You have a fever. Get help right away if: You seem to be worse and are not responding to medicine during an asthma attack. You are short of breath even at rest. You get short of breath when doing very little activity. You have trouble eating, drinking, or talking. You have chest pain or tightness. You have a fast heartbeat. Your lips or fingernails start to turn blue. You are light-headed or dizzy, or you faint. Your peak flow is less than 50% of your personal best. You feel too tired to breathe normally. These symptoms may be an emergency. Get help  right away. Call 911. Do not wait to see if the symptoms will go away. Do not drive yourself to the hospital. Summary Asthma is a long-term (chronic) condition in which the airways get tight and narrow. An asthma attack can make it hard to breathe. Asthma cannot be cured, but medicines and lifestyle changes can help control it. Make sure you understand how to avoid triggers and how and when to use your medicines. Avoid things that can cause allergy symptoms (allergens). These include animal skin flakes (dander) and pollen from trees or grass. Avoid things that pollute the air. These may include household cleaners, wood smoke, smog, or chemical odors. This information is not intended to replace advice given to you by your health care provider. Make sure you discuss any questions you have with your health care provider. Document Revised: 05/02/2021 Document Reviewed: 05/02/2021 Elsevier Patient Education  2023 Elsevier Inc.    If you have been instructed to have an in-person evaluation today at a local Urgent Care facility, please use the link below. It will take you to a list of all of our available [facility], including address, phone number and hours of operation. Please do not delay care.  [facility]  If you or a family member do not have a primary care provider, use the link below to schedule a visit and establish care. When you choose a [facility] primary care physician or advanced practice provider, you gain a long-term partner in health. Find a Primary Care Provider  Learn more about [facility]'s in-office and virtual care options: [facility] - Get Care Now

## 2022-02-20 ENCOUNTER — Other Ambulatory Visit: Payer: Self-pay | Admitting: Internal Medicine

## 2022-03-07 ENCOUNTER — Ambulatory Visit: Payer: BC Managed Care – PPO | Admitting: Allergy

## 2022-03-07 ENCOUNTER — Encounter: Payer: Self-pay | Admitting: Allergy

## 2022-03-07 VITALS — BP 134/90 | HR 89 | Temp 97.9°F | Resp 16 | Ht 79.0 in | Wt 304.0 lb

## 2022-03-07 DIAGNOSIS — J3089 Other allergic rhinitis: Secondary | ICD-10-CM

## 2022-03-07 DIAGNOSIS — H1013 Acute atopic conjunctivitis, bilateral: Secondary | ICD-10-CM

## 2022-03-07 DIAGNOSIS — J4520 Mild intermittent asthma, uncomplicated: Secondary | ICD-10-CM | POA: Diagnosis not present

## 2022-03-07 MED ORDER — LEVALBUTEROL TARTRATE 45 MCG/ACT IN AERO: 2.0000 | INHALATION_SPRAY | RESPIRATORY_TRACT | 2 refills | Status: DC | PRN

## 2022-03-07 MED ORDER — MONTELUKAST SODIUM 10 MG PO TABS: 10.0000 mg | ORAL_TABLET | Freq: Every day | ORAL | 5 refills | Status: DC

## 2022-03-07 MED ORDER — BUDESONIDE-FORMOTEROL FUMARATE 160-4.5 MCG/ACT IN AERO: 2.0000 | INHALATION_SPRAY | Freq: Two times a day (BID) | RESPIRATORY_TRACT | 5 refills | Status: AC

## 2022-03-07 NOTE — Progress Notes (Signed)
New Patient Note  RE: Dawn Navarro MRN: [id number] DOB: 1985/09/02 Date of Office Visit: 03/07/2022  Primary care provider: Margaree Mackintosh, MD  Chief Complaint: allergies  History of present illness: Dawn Navarro is a 36 y.o. female presenting today for evaluation of allergic rhinitis.   She has had bronchitis twice this summer already.  Usually reports she will get bronchitis twice a year typically at the start of pollen season in the spring and toward the end of pollen season in the fall.    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  She states she knows cats trigger her symptoms as well.  She does not feel dogs do as she does have a dog in the home.  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    She reports allergy symptoms of itchy/watery eyes, sneezing fits, dry cough, itchy arms.  She feels her allergy symptoms have become year-round.  Previously was more so during pollen season.   She has tried Zyrtec but became less effective thus her PCP changed her to Xyzal which helps a little it.  She has tried flonase but didn't seem to be helpful and states she hates putting things in the nose.  She tried nasal saline rinses and felt like she was going to die with the use.  She has not tried any eye drops.  She does wear contact lens and takes them out nightly.     Her bronchitis symptoms include increase coughing that is productive and nasal congestion/drainage that is usually green in color. She also reports shortness of breath, wheeze, chest tightness, fatigue.  She has been prescribed albuterol inhaler due to bronchitis episodes and it helps but does cause her to feel jittery and weird feeling thus does not use it as she should.  She has had systemic steroids with these bronchitis episodes.  She had covid illness earlier in the year and feels like she has struggled with her breathing since.    No history of eczema or food allergy.     Review of systems: Review of Systems  Constitutional: Negative.   HENT:         See HPI  Eyes:        See HPI  Respiratory:         See HPI  Cardiovascular: Negative.   Gastrointestinal: Negative.   Musculoskeletal: Negative.   Skin: Negative.   Allergic/Immunologic: Negative.   Neurological: Negative.     All other systems negative unless noted above in HPI  Past medical history: Past Medical History:  Diagnosis Date   Hypothyroidism     Past surgical history: Past Surgical History:  Procedure Laterality Date   CESAREAN SECTION     CESAREAN SECTION WITH BILATERAL TUBAL LIGATION Bilateral 10/01/2013   Procedure: Repeat CESAREAN SECTION;  Surgeon: Freddrick March. Tenny Craw, MD;  Location: [facility];  Service: Obstetrics;  Laterality: Bilateral;    Family history:  Family History  Problem Relation Age of Onset   Cancer Father    Allergic rhinitis Maternal Grandmother    Asthma Maternal Grandmother    Eczema Daughter    Urticaria Neg Hx    Immunodeficiency Neg Hx    Angioedema Neg Hx     Social history: Lives in a home with carpeting in the bedroom with electric heating and central cooling.  Dog in the home.  There is no concern for water damage,  mildew or roaches in the home.  She is an Airline pilot and a Museum/gallery conservator.  She has no smoking history.   Medication List: Current Outpatient Medications  Medication Sig Dispense Refill   albuterol (VENTOLIN HFA) 108 (90 Base) MCG/ACT inhaler Inhale 2 puffs into the lungs every 6 (six) hours as needed for wheezing or shortness of breath. 8 g PRN   atorvastatin (LIPITOR) 80 MG tablet Take 1 tablet (80 mg total) by mouth daily. 90 tablet 3   Calcium Carbonate-Vit D-Min (CALCIUM 1200 PO) Take by mouth.     cetirizine (ZYRTEC ALLERGY) 10 MG tablet Take 10 mg by mouth daily.     cetirizine (ZYRTEC) 10 MG tablet Take 10 mg by mouth daily.     cholecalciferol (VITAMIN D3) 25 MCG (1000 UNIT) tablet Take 1,000 Units by mouth daily.      levocetirizine (XYZAL ALLERGY 24HR) 5 MG tablet Take 1 tablet (5 mg total) by mouth every evening. 30 tablet 5   levonorgestrel (MIRENA) 20 MCG/24HR IUD 1 each by Intrauterine route once. Inserted 04/2021     levothyroxine (SYNTHROID) 100 MCG tablet TAKE 1 TABLET BY MOUTH EVERY DAY 90 tablet 1   metFORMIN (GLUCOPHAGE) 500 MG tablet Take 1 tablet (500 mg total) by mouth 2 (two) times daily with a meal. 180 tablet 3   Multiple Vitamin (MULTIVITAMIN) tablet Take 1 tablet by mouth daily.     sertraline (ZOLOFT) 50 MG tablet TAKE 1 TABLET BY MOUTH EVERY DAY 90 tablet 2   vitamin B-12 (CYANOCOBALAMIN) 500 MCG tablet Take 500 mcg by mouth daily.     No current facility-administered medications for this visit.    Known medication allergies: Allergies  Allergen Reactions   Latex Rash    Skin bubbles up and sluffs off     Physical examination: Blood pressure (!) 134/90, pulse 89, temperature 97.9 F (36.6 C), temperature source Temporal, resp. rate 16, height 6\' 7"  (2.007 m), weight (!) 304 lb (137.9 kg), SpO2 94 %.  General: Alert, interactive, in no acute distress. HEENT: PERRLA, TMs pearly gray, turbinates mildly edematous without discharge, post-pharynx non erythematous. Neck: Supple without lymphadenopathy. Lungs: Clear to auscultation without wheezing, rhonchi or rales. {no increased work of breathing. CV: Normal S1, S2 without murmurs. Abdomen: Nondistended, nontender. Skin: Warm and dry, without lesions or rashes. Extremities:  No clubbing, cyanosis or edema. Neuro:   Grossly intact.  Diagnositics/Labs:  Spirometry: FEV1: 2.97L 87%, FVC: 3.75L 91%, ratio consistent with nonobstructive pattern  Allergy testing:   Airborne Adult Perc - 03/07/22 0944     Time Antigen Placed 0945    Allergen Manufacturer 05/07/22    Location Back    Number of Test 59    1. Control-Buffer 50% Glycerol Negative    2. Control-Histamine 1 mg/ml 2+    3. Albumin saline Negative    4. Bahia Negative     5. Waynette Buttery Negative    6. Johnson Negative    7. Kentucky Blue Negative    8. Meadow Fescue Negative    9. Perennial Rye Negative    10. Sweet Vernal Negative    11. Timothy Negative    12. Cocklebur 2+    13. Burweed Marshelder 2+    14. Ragweed, short 3+    15. Ragweed, Giant Negative    16. Plantain,  English Negative    17. Lamb's Quarters Negative    18. Sheep Sorrell Negative    19. Rough Pigweed Negative    20. [place]  Elder, Rough Negative    21. Mugwort, Common Negative    22. Ash mix Negative    23. Birch mix Negative    24. Beech American Negative    25. Box, Elder Negative    26. Cedar, red Negative    27. Cottonwood, [place] Negative    28. Elm mix Negative    29. Hickory Negative    30. Maple mix Negative    31. Oak, [place] mix Negative    32. Pecan Pollen Negative    33. Pine mix Negative    34. Sycamore Eastern Negative    35. Walnut, Black Pollen Negative    36. Alternaria alternata Negative    37. Cladosporium Herbarum Negative    38. Aspergillus mix Negative    39. Penicillium mix Negative    40. Bipolaris sorokiniana (Helminthosporium) Negative    41. Drechslera spicifera (Curvularia) Negative    42. Mucor plumbeus Negative    43. Fusarium moniliforme Negative    44. Aureobasidium pullulans (pullulara) Negative    45. Rhizopus oryzae Negative    46. Botrytis cinera Negative    47. Epicoccum nigrum Negative    48. Phoma betae Negative    49. Candida Albicans Negative    50. Trichophyton mentagrophytes Negative    51. Mite, D Farinae  5,000 AU/ml 2+    52. Mite, D Pteronyssinus  5,000 AU/ml 2+    53. Cat Hair 10,000 BAU/ml 2+    54.  Dog Epithelia Negative    55. Mixed Feathers Negative    56. Horse Epithelia Negative    57. Cockroach, German 2+    58. Mouse Negative    59. Tobacco Leaf Negative             Intradermal - 03/07/22 1037     Time Antigen Placed 1037    Allergen Manufacturer Other    Location Arm    Number of Test 10     Control Negative    [place] Negative    Johnson Negative    7 Grass Negative    Tree mix 2+    Mold 1 Negative    Mold 2 2+    Mold 3 Negative    Mold 4 2+    Dog 2+             Allergy testing results were read and interpreted by provider, documented by clinical staff.   Assessment and plan: Allergic rhinitis with conjunctivitis - Testing today showed: ragweed, weeds, trees, indoor molds, outdoor molds, dust mites, cat, dog, and cockroach. - Copy of test results provided.  - Avoidance measures provided. - Continue with: Xyzal (levocetirizine) 5mg  tablet once daily.  This is an antihistamine.   - Start taking: Singulair (montelukast) 10mg  daily.  This is an antileukotriene and can work alongside antihistamine for more allergy symptom control.  Singulair also helps with airway control.  If you notice any change in mood/behavior/sleep after starting Singulair then stop this medication and let know.  Symptoms resolve after stopping the medication.   Pataday (olopatadine) one drop per eye daily as needed for itchy or watery eyes - You can use an extra dose of the antihistamine, if needed, for breakthrough symptoms.  - Consider allergy shots as a means of long-term control if medication management is not controlling symptoms. - Allergy shots "re-train" and "reset" the immune system to ignore environmental allergens and decrease the resulting immune response to those allergens (sneezing, itchy watery eyes, runny nose, nasal  congestion, etc).    - Allergy shots improve symptoms in 80-85% of patients.   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  - Breathing  control goals:  * Full participation in all desired activities (may need albuterol before activity) * Albuterol use two time or less a week on average (not counting use with activity) * Cough interfering with sleep two time or less a month * Oral steroids no more than once a year * No hospitalizations   Follow-up in 3-4 months or sooner if needed  No follow-ups on file.  I appreciate the opportunity to take part in Kolbie's care. Please do not hesitate to contact me with questions.  Sincerely,   Margo Aye, MD Allergy/Immunology [facility]

## 2022-03-07 NOTE — Patient Instructions (Signed)
-   Testing today showed: ragweed, weeds, trees, indoor molds, outdoor molds, dust mites, cat, dog, and cockroach. - Copy of test results provided.  - Avoidance measures provided. - Continue with: Xyzal (levocetirizine) 5mg  tablet once daily.  This is an antihistamine.   - Start taking: Singulair (montelukast) 10mg  daily.  This is an antileukotriene and can work alongside antihistamine for more allergy symptom control.  Singulair also helps with airway control.  If you notice any change in mood/behavior/sleep after starting Singulair then stop this medication and let know.  Symptoms resolve after stopping the medication.   Pataday (olopatadine) one drop per eye daily as needed for itchy or watery eyes - You can use an extra dose of the antihistamine, if needed, for breakthrough symptoms.  - Consider allergy shots as a means of long-term control if medication management is not controlling symptoms. - Allergy shots "re-train" and "reset" the immune system to ignore environmental allergens and decrease the resulting immune response to those allergens (sneezing, itchy watery eyes, runny nose, nasal congestion, etc).    - Allergy shots improve symptoms in 80-85% of patients.   - Lung function testing is normal today - Rescue medications: Xopenex 2 puffs every 4 hours as needed (this is lung specific and should not cause jitteriness like albuterol - Changes during respiratory illnesses or worsening symptoms: Add on Symbicort [place] 2 puffs twice daily for TWO WEEKS or until symptoms resolve.  - Breathing control goals:  * Full participation in all desired activities (may need albuterol before activity) * Albuterol use two time or less a week on average (not counting use with activity) * Cough interfering with sleep two time or less a month * Oral steroids no more than once a year * No hospitalizations   Follow-up in 3-4 months or sooner if needed

## 2022-04-06 ENCOUNTER — Other Ambulatory Visit: Payer: Self-pay | Admitting: Internal Medicine

## 2022-04-17 DIAGNOSIS — E039 Hypothyroidism, unspecified: Secondary | ICD-10-CM | POA: Diagnosis not present

## 2022-04-17 DIAGNOSIS — E785 Hyperlipidemia, unspecified: Secondary | ICD-10-CM | POA: Diagnosis not present

## 2022-04-17 DIAGNOSIS — Z6841 Body Mass Index (BMI) 40.0 and over, adult: Secondary | ICD-10-CM | POA: Diagnosis not present

## 2022-04-17 DIAGNOSIS — Z0279 Encounter for issue of other medical certificate: Secondary | ICD-10-CM

## 2022-04-17 DIAGNOSIS — R5383 Other fatigue: Secondary | ICD-10-CM | POA: Diagnosis not present

## 2022-04-18 ENCOUNTER — Other Ambulatory Visit: Payer: BC Managed Care – PPO

## 2022-04-18 ENCOUNTER — Telehealth: Payer: Self-pay | Admitting: Internal Medicine

## 2022-04-18 NOTE — Telephone Encounter (Signed)
Faxed 135 pages of medical records to [facility] in [place]. Patient changing PCP to someone closer to home.

## 2022-04-28 ENCOUNTER — Ambulatory Visit: Payer: BC Managed Care – PPO | Admitting: Internal Medicine

## 2022-06-01 ENCOUNTER — Other Ambulatory Visit: Payer: Self-pay | Admitting: Internal Medicine

## 2022-06-07 DIAGNOSIS — Z6841 Body Mass Index (BMI) 40.0 and over, adult: Secondary | ICD-10-CM | POA: Diagnosis not present

## 2022-06-22 ENCOUNTER — Other Ambulatory Visit: Payer: Self-pay | Admitting: Internal Medicine

## 2022-06-27 ENCOUNTER — Ambulatory Visit: Payer: BC Managed Care – PPO | Admitting: Allergy

## 2022-08-22 ENCOUNTER — Ambulatory Visit (INDEPENDENT_AMBULATORY_CARE_PROVIDER_SITE_OTHER): Payer: BC Managed Care – PPO | Admitting: Allergy

## 2022-08-22 ENCOUNTER — Encounter: Payer: Self-pay | Admitting: Allergy

## 2022-08-22 VITALS — BP 118/74 | HR 89 | Resp 16

## 2022-08-22 DIAGNOSIS — J4520 Mild intermittent asthma, uncomplicated: Secondary | ICD-10-CM | POA: Diagnosis not present

## 2022-08-22 DIAGNOSIS — J3089 Other allergic rhinitis: Secondary | ICD-10-CM

## 2022-08-22 DIAGNOSIS — H1013 Acute atopic conjunctivitis, bilateral: Secondary | ICD-10-CM | POA: Diagnosis not present

## 2022-08-22 DIAGNOSIS — B3731 Acute candidiasis of vulva and vagina: Secondary | ICD-10-CM

## 2022-08-22 MED ORDER — MONTELUKAST SODIUM 10 MG PO TABS: 10.0000 mg | ORAL_TABLET | Freq: Every day | ORAL | 5 refills | Status: DC

## 2022-08-22 MED ORDER — LEVALBUTEROL TARTRATE 45 MCG/ACT IN AERO: 2.0000 | INHALATION_SPRAY | RESPIRATORY_TRACT | 2 refills | Status: AC | PRN

## 2022-08-22 MED ORDER — FLUCONAZOLE 150 MG PO TABS: 150.0000 mg | ORAL_TABLET | Freq: Every day | ORAL | 0 refills | Status: DC

## 2022-08-22 NOTE — Progress Notes (Signed)
Follow-up Note  RE: Dawn Navarro MRN: [id number] DOB: Feb 28, 1986 Date of Office Visit: 08/22/2022   History of present illness: Dawn Navarro is a 37 y.o. female presenting today for follow-up of allergic rhinitis symptoms and reactive airway.  She was last seen in the office on 03/07/2022 by myself.  She has done well since this visit without any major health changes, surgeries or hospitalizations.  She states she has not even had any type of illness since this visit. She states her allergy symptoms have been quite controlled especially with the cessation of weed pollen season.  She is taking Xyzal and Singulair daily.  The combination of these 2 medications she states she has not needed to use Pataday. Since she has not had any illnesses she has not required any albuterol use.  She does not like to use albuterol as it does make her jittery.  I did send in [place] after her last visit to have her have access to a rescue inhaler that was not going to cause cardiac effects.  However she states with the previous insurance she had it was not approved.  She also has access to Symbicort also inhaler she has not needed to use that she has not had any illness. She does note that she mistakenly used a scented body soap in the genital area and this has caused her to have yeast infections in the past.  She currently has a yeast infection due to using the scented body soap.  She states it is normally treated with Diflucan.  Review of systems: Review of Systems  Constitutional: Negative.   HENT: Negative.    Eyes: Negative.   Respiratory: Negative.    Cardiovascular: Negative.   Gastrointestinal: Negative.   Musculoskeletal: Negative.   Skin:        Irritation vulvar area  Allergic/Immunologic: Negative.   Neurological: Negative.      All other systems negative unless noted above in HPI  Past medical/social/surgical/family history have been reviewed and are unchanged unless specifically  indicated below.  No changes  Medication List: Current Outpatient Medications  Medication Sig Dispense Refill   atorvastatin (LIPITOR) 80 MG tablet Take 1 tablet (80 mg total) by mouth daily. 90 tablet 3   budesonide-formoterol (SYMBICORT) 160-4.5 MCG/ACT inhaler Inhale 2 puffs into the lungs 2 (two) times daily. For 2 weeks during flares 1 each 5   Calcium Carbonate-Vit D-Min (CALCIUM 1200 PO) Take by mouth.     cetirizine (ZYRTEC ALLERGY) 10 MG tablet Take 10 mg by mouth daily.     cetirizine (ZYRTEC) 10 MG tablet Take 10 mg by mouth daily.     cholecalciferol (VITAMIN D3) 25 MCG (1000 UNIT) tablet Take 1,000 Units by mouth daily.     fluconazole (DIFLUCAN) 150 MG tablet Take 1 tablet (150 mg total) by mouth daily. 3 tablet 0   levocetirizine (XYZAL ALLERGY 24HR) 5 MG tablet Take 1 tablet (5 mg total) by mouth every evening. 30 tablet 5   levonorgestrel (MIRENA) 20 MCG/24HR IUD 1 each by Intrauterine route once. Inserted 04/2021     levothyroxine (SYNTHROID) 100 MCG tablet TAKE 1 TABLET BY MOUTH EVERY DAY 90 tablet 1   metFORMIN (GLUCOPHAGE) 500 MG tablet Take 1 tablet (500 mg total) by mouth 2 (two) times daily with a meal. 180 tablet 3   Multiple Vitamin (MULTIVITAMIN) tablet Take 1 tablet by mouth daily.     sertraline (ZOLOFT) 50 MG tablet TAKE 1 TABLET BY MOUTH EVERY  DAY 90 tablet 2   vitamin B-12 (CYANOCOBALAMIN) 500 MCG tablet Take 500 mcg by mouth daily.     levalbuterol (XOPENEX HFA) 45 MCG/ACT inhaler Inhale 2 puffs into the lungs every 4 (four) hours as needed for wheezing. 1 each 2   montelukast (SINGULAIR) 10 MG tablet Take 1 tablet (10 mg total) by mouth at bedtime. 30 tablet 5   No current facility-administered medications for this visit.     Known medication allergies: Allergies  Allergen Reactions   Latex Rash     Physical examination: Blood pressure 118/74, pulse 89, resp. rate 16, SpO2 97 %.  General: Alert, interactive, in no acute distress. HEENT: PERRLA,  TMs pearly gray, turbinates non-edematous without discharge, post-pharynx non erythematous. Neck: Supple without lymphadenopathy. Lungs: Clear to auscultation without wheezing, rhonchi or rales. {no increased work of breathing. CV: Normal S1, S2 without murmurs. Abdomen: Nondistended, nontender. Skin: Warm and dry, without lesions or rashes. Extremities:  No clubbing, cyanosis or edema. Neuro:   Grossly intact.  Diagnositics/Labs: None today  Assessment and plan: Allergic rhinitis with conjunctivitis  - continue avoidance measures for ragweed, weeds, trees, indoor molds, outdoor molds, dust mites, cat, dog, and cockroach. - Continue with: Xyzal (levocetirizine) 5mg  tablet once daily AND Singulair (montelukast) 10mg  daily.   Pataday (olopatadine) one drop per eye daily as needed for itchy or watery eyes.  - You can use an extra dose of the antihistamine, if needed, for breakthrough symptoms.  - Consider allergy shots as a means of long-term control if medication management is not controlling symptoms. - Allergy shots "re-train" and "reset" the immune system to ignore environmental allergens and decrease the resulting immune response to those allergens (sneezing, itchy watery eyes, runny nose, nasal congestion, etc).    - Allergy shots improve symptoms in 80-85% of patients.   Reactive airway due to illness/allergen - Rescue medications: Xopenex 2 puffs every 4 hours as needed (this is lung specific and should not cause jitteriness like albuterol).  Will order again with new insurance.  - Changes during respiratory illnesses or worsening symptoms: Add on Symbicort 161mcg 2 puffs twice daily for TWO WEEKS or until symptoms resolve. - Symbicort can be used as a rescue inhaler as well.  Max dosing of Symbicort is 10-12 puffs per day.   Rinse mouth after use.   - Breathing control goals:  * Full participation in all desired activities (may need albuterol before activity) * Albuterol use two  time or less a week on average (not counting use with activity) * Cough interfering with sleep two time or less a month * Oral steroids no more than once a year * No hospitalizations   Vaginal candidiasis - Take Diflucan for next 3 days for yeast infection  Follow-up in 6 months or sooner if needed  I appreciate the opportunity to take part in Kriss's care. Please do not hesitate to contact me with questions.  Sincerely,   Prudy Feeler, MD Allergy/Immunology [facility] [place] [facility]

## 2022-08-22 NOTE — Patient Instructions (Addendum)
-  continue avoidance measures for ragweed, weeds, trees, indoor molds, outdoor molds, dust mites, cat, dog, and cockroach. - Continue with: Xyzal (levocetirizine) 5mg  tablet once daily AND Singulair (montelukast) 10mg  daily.   Pataday (olopatadine) one drop per eye daily as needed for itchy or watery eyes.  - You can use an extra dose of the antihistamine, if needed, for breakthrough symptoms.  - Consider allergy shots as a means of long-term control if medication management is not controlling symptoms. - Allergy shots "re-train" and "reset" the immune system to ignore environmental allergens and decrease the resulting immune response to those allergens (sneezing, itchy watery eyes, runny nose, nasal congestion, etc).    - Allergy shots improve symptoms in 80-85% of patients.   - Rescue medications: Xopenex 2 puffs every 4 hours as needed (this is lung specific and should not cause jitteriness like albuterol).  Will order again with new insurance.  - Changes during respiratory illnesses or worsening symptoms: Add on Symbicort 135mcg 2 puffs twice daily for TWO WEEKS or until symptoms resolve. - Symbicort can be used as a rescue inhaler as well.  Max dosing of Symbicort is 10-12 puffs per day.   Rinse mouth after use.   - Breathing control goals:  * Full participation in all desired activities (may need albuterol before activity) * Albuterol use two time or less a week on average (not counting use with activity) * Cough interfering with sleep two time or less a month * Oral steroids no more than once a year * No hospitalizations   - Take Diflucan for next 3 days for yeast infection  Follow-up in 6  months or sooner if needed

## 2022-10-09 ENCOUNTER — Other Ambulatory Visit: Payer: Self-pay | Admitting: Internal Medicine

## 2022-12-07 ENCOUNTER — Other Ambulatory Visit: Payer: Self-pay | Admitting: Internal Medicine

## 2023-02-13 ENCOUNTER — Other Ambulatory Visit: Payer: Self-pay | Admitting: Allergy

## 2023-03-09 ENCOUNTER — Other Ambulatory Visit: Payer: Self-pay | Admitting: Allergy

## 2023-03-12 ENCOUNTER — Other Ambulatory Visit: Payer: Self-pay | Admitting: Allergy

## 2023-03-21 ENCOUNTER — Other Ambulatory Visit: Payer: Self-pay | Admitting: Internal Medicine

## 2023-03-21 NOTE — Telephone Encounter (Signed)
Yes she has changed PCP's

## 2023-03-21 NOTE — Telephone Encounter (Signed)
Last refill 06/22/22 #90 + 2 refills.

## 2023-04-10 ENCOUNTER — Other Ambulatory Visit: Payer: Self-pay | Admitting: Allergy

## 2023-04-10 NOTE — Telephone Encounter (Signed)
Called and left a message for patient to call our office back to schedule her follow up appointment.

## 2023-05-05 ENCOUNTER — Other Ambulatory Visit: Payer: Self-pay | Admitting: Allergy

## 2023-05-09 ENCOUNTER — Other Ambulatory Visit: Payer: Self-pay | Admitting: Allergy

## 2023-05-14 ENCOUNTER — Other Ambulatory Visit: Payer: Self-pay

## 2023-05-14 MED ORDER — MONTELUKAST SODIUM 10 MG PO TABS: ORAL_TABLET | ORAL | 0 refills | Status: DC

## 2023-05-30 ENCOUNTER — Ambulatory Visit: Payer: BLUE CROSS/BLUE SHIELD | Admitting: Allergy and Immunology

## 2023-05-30 ENCOUNTER — Encounter: Payer: Self-pay | Admitting: Allergy and Immunology

## 2023-05-30 VITALS — BP 122/82 | HR 82 | Resp 18

## 2023-05-30 DIAGNOSIS — J3089 Other allergic rhinitis: Secondary | ICD-10-CM

## 2023-05-30 DIAGNOSIS — J301 Allergic rhinitis due to pollen: Secondary | ICD-10-CM

## 2023-05-30 DIAGNOSIS — J4520 Mild intermittent asthma, uncomplicated: Secondary | ICD-10-CM | POA: Diagnosis not present

## 2023-05-30 MED ORDER — AIRSUPRA 90-80 MCG/ACT IN AERO: 2.0000 | INHALATION_SPRAY | Freq: Four times a day (QID) | RESPIRATORY_TRACT | 1 refills | Status: AC | PRN

## 2023-05-30 MED ORDER — MONTELUKAST SODIUM 10 MG PO TABS: ORAL_TABLET | ORAL | 5 refills | Status: DC

## 2023-05-30 MED ORDER — PREDNISONE 10 MG PO TABS: ORAL_TABLET | ORAL | 0 refills | Status: AC

## 2023-05-30 NOTE — Progress Notes (Unsigned)
[facility] [place] [facility] [place] [facility] [place] [facility] [place]   Follow-up Note  Referring Provider: Dorene Grebe* Primary Provider: Alroy Bailiff Date of Office Visit: 05/30/2023  Subjective:   Dawn Navarro (DOB: [birth year]) is a 37 y.o. female who [facility] in re-evaluation of the following:  HPI: [name] presents to this clinic in evaluation of allergic rhinoconjunctivitis and asthma.  I have never seen her in this clinic and her last visit with Dr. Delorse Lek was 22 August 2022.  She feels that she has done pretty well through the spring and the summer in the fall on her plan of using montelukast and some antihistamines but she has had a flareup of her allergic rhinoconjunctivitis over the course of the past 2 weeks or so.  She has had a lot of snot that was initially green and itchy eyes and ear pressure and some intermittent headache without any anosmia or ugly nasal discharge at this point in time and no associated systemic or constitutional symptoms.  Her asthma appears to be under excellent control with minimal amounts of medications.  She does not really use a controller agent.  Her use of a short acting bronchodilator is less than 1 time per month.  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  Allergies as of 05/30/2023       Reactions   Latex Rash        Medication List    atorvastatin 80 MG tablet Commonly known as: LIPITOR TAKE 1 TABLET BY MOUTH EVERY DAY   budesonide-formoterol 160-4.5 MCG/ACT inhaler Commonly known as: Symbicort Inhale 2 puffs into the lungs 2 (two) times daily. For 2 weeks during flares   CALCIUM 1200 PO Take by mouth.   cholecalciferol 25 MCG (1000 UNIT) tablet Commonly known as: VITAMIN D3 Take 1,000 Units by mouth daily.   cyanocobalamin 500 MCG tablet Commonly known as: VITAMIN B12 Take 500  mcg by mouth daily.   levalbuterol 45 MCG/ACT inhaler Commonly known as: Xopenex HFA Inhale 2 puffs into the lungs every 4 (four) hours as needed for wheezing.   levocetirizine 5 MG tablet Commonly known as: Xyzal Allergy 24HR Take 1 tablet (5 mg total) by mouth every evening.   levonorgestrel 20 MCG/24HR IUD Commonly known as: MIRENA 1 each by Intrauterine route once. Inserted 04/2021   levothyroxine 100 MCG tablet Commonly known as: SYNTHROID TAKE 1 TABLET BY MOUTH EVERY DAY   metFORMIN 500 MG tablet Commonly known as: GLUCOPHAGE Take 1 tablet (500 mg total) by mouth 2 (two) times daily with a meal.   montelukast 10 MG tablet Commonly known as: SINGULAIR TAKE 1 TABLET BY MOUTH EVERYDAY AT BEDTIME   multivitamin tablet Take 1 tablet by mouth daily.   sertraline 50 MG tablet Commonly known as: ZOLOFT TAKE 1 TABLET BY MOUTH EVERY DAY    Past Medical History:  Diagnosis Date   Hypothyroidism     Past Surgical History:  Procedure Laterality Date   CESAREAN SECTION     CESAREAN SECTION WITH BILATERAL TUBAL LIGATION Bilateral 10/01/2013   Procedure: Repeat CESAREAN SECTION;  Surgeon: Freddrick March. Tenny Craw, MD;  Location: [facility];  Service: Obstetrics;  Laterality: Bilateral;    Review of systems negative except as noted in HPI / PMHx or noted below:  Review of Systems  Constitutional: Negative.   HENT: Negative.  Eyes: Negative.   Respiratory: Negative.    Cardiovascular: Negative.   Gastrointestinal: Negative.   Genitourinary: Negative.   Musculoskeletal: Negative.   Skin: Negative.   Neurological: Negative.   Endo/Heme/Allergies: Negative.   Psychiatric/Behavioral: Negative.       Objective:   Vitals:   05/30/23 0840  BP: 122/82  Pulse: 82  Resp: 18  SpO2: 99%          Physical Exam Constitutional:      Appearance: She is not diaphoretic.  HENT:     Head: Normocephalic.     Right Ear: Tympanic membrane, ear canal and external ear normal.      Left Ear: Tympanic membrane, ear canal and external ear normal.     Nose: Nose normal. No mucosal edema or rhinorrhea.     Mouth/Throat:     Pharynx: Uvula midline. No oropharyngeal exudate.  Eyes:     Conjunctiva/sclera: Conjunctivae normal.  Neck:     Thyroid: No thyromegaly.     Trachea: Trachea normal. No tracheal tenderness or tracheal deviation.  Cardiovascular:     Rate and Rhythm: Normal rate and regular rhythm.     Heart sounds: Normal heart sounds, S1 normal and S2 normal. No murmur heard. Pulmonary:     Effort: No respiratory distress.     Breath sounds: Normal breath sounds. No stridor. No wheezing or rales.  Lymphadenopathy:     Head:     Right side of head: No tonsillar adenopathy.     Left side of head: No tonsillar adenopathy.     Cervical: No cervical adenopathy.  Skin:    Findings: No erythema or rash.     Nails: There is no clubbing.  Neurological:     Mental Status: She is alert.     Diagnostics: Spirometry was performed and demonstrated an FEV1 of 3.53 at 104 % of predicted.  Assessment and Plan:   1. Perennial allergic rhinitis   2. Seasonal allergic rhinitis due to pollen   3. Asthma, mild intermittent, well-controlled    1. Allergen avoidance measures - sports glasses  2. Do not rub or touch eyes  3. Treat and prevent inflammation:   A. Montelukast 10 mg - 1 tablet 1 time per day  B. OTC Nasacort - 2 sprays each nostril 3-7 times per week  4. If needed:   A. AIRSUPRA - 2 INHALATIONS EVERY 6 HOURS (coupon)  B. Xyzal 5 mg / cetirizine 10 mg - 1 time per day  C. Pataday - 1 drop each eye 1 time per day  5. For this recent event:   A. Nasal saline   B. Prednisone 10 mg today and daily for 4 additional days only  6. Immunotherapy???  7. Return to clinic in 6 months or earlier if problem  8. Plan for fall flu vaccine  Aquasia may have recently developed a respiratory tract infection or a flare of her allergic disease from grass/hay  exposure and I will give her a very short course of systemic steroids as noted above and I have encouraged her to use montelukast and Nasacort on a consistent basis and she has a selection of agents to be utilized should they be required as noted above.  While she is working with grass/hay she should wear some sports glasses.  If she fails medical therapy she would deftly be a candidate for immunotherapy.  Laurette Schimke, MD Allergy / Immunology [facility]

## 2023-05-30 NOTE — Patient Instructions (Addendum)
  1. Allergen avoidance measures - sports glasses  2. Do not rub or touch eyes  3. Treat and prevent inflammation:   A. Montelukast 10 mg - 1 tablet 1 time per day  B. OTC Nasacort - 2 sprays each nostril 3-7 times per week  4. If needed:   A. AIRSUPRA - 2 INHALATIONS EVERY 6 HOURS (coupon)  B. Xyzal 5 mg / cetirizine 10 mg - 1 time per day  C. Pataday - 1 drop each eye 1 time per day  5. For this recent event:   A. Nasal saline   B. Prednisone 10 mg today and daily for 4 additional days only  6. Immunotherapy???  7. Return to clinic in 6 months or earlier if problem  8. Plan for fall flu vaccine

## 2023-05-31 ENCOUNTER — Encounter: Payer: Self-pay | Admitting: Allergy and Immunology

## 2023-07-08 ENCOUNTER — Other Ambulatory Visit: Payer: Self-pay | Admitting: Internal Medicine

## 2023-07-08 DIAGNOSIS — E282 Polycystic ovarian syndrome: Secondary | ICD-10-CM

## 2023-11-13 ENCOUNTER — Other Ambulatory Visit: Payer: Self-pay | Admitting: Internal Medicine

## 2023-11-13 DIAGNOSIS — E282 Polycystic ovarian syndrome: Secondary | ICD-10-CM

## 2023-11-24 ENCOUNTER — Other Ambulatory Visit: Payer: Self-pay | Admitting: Allergy and Immunology

## 2023-11-24 ENCOUNTER — Other Ambulatory Visit: Payer: Self-pay | Admitting: Internal Medicine

## 2023-11-26 ENCOUNTER — Other Ambulatory Visit: Payer: Self-pay

## 2023-11-26 DIAGNOSIS — E7849 Other hyperlipidemia: Secondary | ICD-10-CM

## 2023-11-26 MED ORDER — ATORVASTATIN CALCIUM 80 MG PO TABS: 80.0000 mg | ORAL_TABLET | Freq: Every day | ORAL | 3 refills | Status: AC

## 2024-04-01 ENCOUNTER — Other Ambulatory Visit: Payer: Self-pay | Admitting: Allergy and Immunology
